# Patient Record
Sex: Female | Born: 1935 | Race: White | Hispanic: No | Smoking: Former smoker
Health system: Southern US, Community
[De-identification: ages and names within clinical notes are randomized; demographics above are authoritative.]

## PROBLEM LIST (undated history)

## (undated) DIAGNOSIS — E039 Hypothyroidism, unspecified: Secondary | ICD-10-CM

## (undated) DIAGNOSIS — M8580 Other specified disorders of bone density and structure, unspecified site: Secondary | ICD-10-CM

## (undated) DIAGNOSIS — I639 Cerebral infarction, unspecified: Secondary | ICD-10-CM

## (undated) DIAGNOSIS — G309 Alzheimer's disease, unspecified: Secondary | ICD-10-CM

## (undated) DIAGNOSIS — E119 Type 2 diabetes mellitus without complications: Secondary | ICD-10-CM

## (undated) DIAGNOSIS — M1990 Unspecified osteoarthritis, unspecified site: Secondary | ICD-10-CM

## (undated) DIAGNOSIS — F0280 Dementia in other diseases classified elsewhere without behavioral disturbance: Secondary | ICD-10-CM

## (undated) DIAGNOSIS — E785 Hyperlipidemia, unspecified: Secondary | ICD-10-CM

## (undated) DIAGNOSIS — M7070 Other bursitis of hip, unspecified hip: Secondary | ICD-10-CM

## (undated) DIAGNOSIS — I10 Essential (primary) hypertension: Secondary | ICD-10-CM

## (undated) DIAGNOSIS — K5900 Constipation, unspecified: Secondary | ICD-10-CM

## (undated) HISTORY — PX: OTHER SURGICAL HISTORY: SHX169

---

## 1998-02-20 ENCOUNTER — Ambulatory Visit (HOSPITAL_COMMUNITY): Admission: RE | Admit: 1998-02-20 | Discharge: 1998-02-20 | Payer: Self-pay | Admitting: Obstetrics & Gynecology

## 1999-11-04 ENCOUNTER — Other Ambulatory Visit: Admission: RE | Admit: 1999-11-04 | Discharge: 1999-11-04 | Payer: Self-pay | Admitting: Internal Medicine

## 2000-05-25 ENCOUNTER — Other Ambulatory Visit: Admission: RE | Admit: 2000-05-25 | Discharge: 2000-05-25 | Payer: Self-pay | Admitting: Obstetrics and Gynecology

## 2000-06-18 ENCOUNTER — Encounter (INDEPENDENT_AMBULATORY_CARE_PROVIDER_SITE_OTHER): Payer: Self-pay | Admitting: Specialist

## 2000-06-18 ENCOUNTER — Other Ambulatory Visit: Admission: RE | Admit: 2000-06-18 | Discharge: 2000-06-18 | Payer: Self-pay | Admitting: Obstetrics and Gynecology

## 2000-10-22 ENCOUNTER — Encounter: Admission: RE | Admit: 2000-10-22 | Discharge: 2000-10-22 | Payer: Self-pay | Admitting: Geriatric Medicine

## 2000-10-22 ENCOUNTER — Encounter: Payer: Self-pay | Admitting: Geriatric Medicine

## 2000-11-12 ENCOUNTER — Encounter: Payer: Self-pay | Admitting: Specialist

## 2000-11-19 ENCOUNTER — Inpatient Hospital Stay (HOSPITAL_COMMUNITY): Admission: RE | Admit: 2000-11-19 | Discharge: 2000-11-22 | Payer: Self-pay | Admitting: Specialist

## 2001-08-19 ENCOUNTER — Other Ambulatory Visit: Admission: RE | Admit: 2001-08-19 | Discharge: 2001-08-19 | Payer: Self-pay | Admitting: Obstetrics and Gynecology

## 2002-05-10 ENCOUNTER — Emergency Department (HOSPITAL_COMMUNITY): Admission: EM | Admit: 2002-05-10 | Discharge: 2002-05-10 | Payer: Self-pay | Admitting: *Deleted

## 2002-06-25 ENCOUNTER — Encounter: Payer: Self-pay | Admitting: Emergency Medicine

## 2002-06-25 ENCOUNTER — Emergency Department (HOSPITAL_COMMUNITY): Admission: EM | Admit: 2002-06-25 | Discharge: 2002-06-26 | Payer: Self-pay | Admitting: Emergency Medicine

## 2002-06-28 ENCOUNTER — Encounter: Admission: RE | Admit: 2002-06-28 | Discharge: 2002-06-28 | Payer: Self-pay | Admitting: Geriatric Medicine

## 2002-06-28 ENCOUNTER — Encounter: Payer: Self-pay | Admitting: Geriatric Medicine

## 2002-09-19 ENCOUNTER — Encounter: Payer: Self-pay | Admitting: Geriatric Medicine

## 2002-09-19 ENCOUNTER — Encounter: Admission: RE | Admit: 2002-09-19 | Discharge: 2002-09-19 | Payer: Self-pay | Admitting: Geriatric Medicine

## 2002-11-06 ENCOUNTER — Encounter: Payer: Self-pay | Admitting: Emergency Medicine

## 2002-11-06 ENCOUNTER — Inpatient Hospital Stay (HOSPITAL_COMMUNITY): Admission: EM | Admit: 2002-11-06 | Discharge: 2002-11-10 | Payer: Self-pay | Admitting: Emergency Medicine

## 2002-11-08 ENCOUNTER — Encounter: Payer: Self-pay | Admitting: Geriatric Medicine

## 2002-11-22 ENCOUNTER — Encounter: Admission: RE | Admit: 2002-11-22 | Discharge: 2003-02-20 | Payer: Self-pay | Admitting: Geriatric Medicine

## 2003-02-28 ENCOUNTER — Encounter: Admission: RE | Admit: 2003-02-28 | Discharge: 2003-05-29 | Payer: Self-pay | Admitting: Geriatric Medicine

## 2003-03-27 ENCOUNTER — Other Ambulatory Visit: Admission: RE | Admit: 2003-03-27 | Discharge: 2003-03-27 | Payer: Self-pay | Admitting: Obstetrics and Gynecology

## 2003-10-10 ENCOUNTER — Ambulatory Visit (HOSPITAL_COMMUNITY): Admission: RE | Admit: 2003-10-10 | Discharge: 2003-10-10 | Payer: Self-pay | Admitting: Gastroenterology

## 2003-11-27 ENCOUNTER — Ambulatory Visit (HOSPITAL_COMMUNITY): Admission: RE | Admit: 2003-11-27 | Discharge: 2003-11-27 | Payer: Self-pay | Admitting: Neurology

## 2004-04-01 ENCOUNTER — Emergency Department (HOSPITAL_COMMUNITY): Admission: EM | Admit: 2004-04-01 | Discharge: 2004-04-01 | Payer: Self-pay | Admitting: Emergency Medicine

## 2005-01-18 ENCOUNTER — Emergency Department (HOSPITAL_COMMUNITY): Admission: EM | Admit: 2005-01-18 | Discharge: 2005-01-18 | Payer: Self-pay | Admitting: Emergency Medicine

## 2005-02-05 ENCOUNTER — Encounter: Admission: RE | Admit: 2005-02-05 | Discharge: 2005-02-05 | Payer: Self-pay | Admitting: Geriatric Medicine

## 2005-03-29 ENCOUNTER — Emergency Department (HOSPITAL_COMMUNITY): Admission: EM | Admit: 2005-03-29 | Discharge: 2005-03-29 | Payer: Self-pay | Admitting: Emergency Medicine

## 2005-08-04 ENCOUNTER — Other Ambulatory Visit: Admission: RE | Admit: 2005-08-04 | Discharge: 2005-08-04 | Payer: Self-pay | Admitting: Obstetrics and Gynecology

## 2006-11-30 ENCOUNTER — Other Ambulatory Visit: Admission: RE | Admit: 2006-11-30 | Discharge: 2006-11-30 | Payer: Self-pay | Admitting: Obstetrics and Gynecology

## 2007-09-10 DIAGNOSIS — I639 Cerebral infarction, unspecified: Secondary | ICD-10-CM

## 2007-09-10 HISTORY — DX: Cerebral infarction, unspecified: I63.9

## 2007-09-29 ENCOUNTER — Inpatient Hospital Stay (HOSPITAL_COMMUNITY): Admission: EM | Admit: 2007-09-29 | Discharge: 2007-10-07 | Payer: Self-pay | Admitting: Emergency Medicine

## 2007-09-30 ENCOUNTER — Encounter (INDEPENDENT_AMBULATORY_CARE_PROVIDER_SITE_OTHER): Payer: Self-pay | Admitting: Internal Medicine

## 2007-09-30 ENCOUNTER — Ambulatory Visit: Payer: Self-pay | Admitting: Vascular Surgery

## 2008-03-28 ENCOUNTER — Other Ambulatory Visit: Admission: RE | Admit: 2008-03-28 | Discharge: 2008-03-28 | Payer: Self-pay | Admitting: Obstetrics and Gynecology

## 2009-05-29 ENCOUNTER — Encounter (INDEPENDENT_AMBULATORY_CARE_PROVIDER_SITE_OTHER): Payer: Self-pay | Admitting: Internal Medicine

## 2009-05-29 ENCOUNTER — Inpatient Hospital Stay (HOSPITAL_COMMUNITY): Admission: EM | Admit: 2009-05-29 | Discharge: 2009-05-31 | Payer: Self-pay | Admitting: Emergency Medicine

## 2010-06-19 ENCOUNTER — Emergency Department (HOSPITAL_COMMUNITY): Admission: EM | Admit: 2010-06-19 | Discharge: 2010-06-20 | Payer: Self-pay | Admitting: Emergency Medicine

## 2010-07-14 ENCOUNTER — Emergency Department (HOSPITAL_COMMUNITY): Admission: EM | Admit: 2010-07-14 | Discharge: 2010-07-14 | Payer: Self-pay | Admitting: Emergency Medicine

## 2010-09-02 ENCOUNTER — Encounter: Admission: RE | Admit: 2010-09-02 | Discharge: 2010-09-02 | Payer: Self-pay | Admitting: Geriatric Medicine

## 2011-01-22 LAB — DIFFERENTIAL
Basophils Absolute: 0 10*3/uL (ref 0.0–0.1)
Basophils Relative: 0 % (ref 0–1)
Eosinophils Relative: 11 % — ABNORMAL HIGH (ref 0–5)
Lymphocytes Relative: 14 % (ref 12–46)
Neutrophils Relative %: 62 % (ref 43–77)

## 2011-01-22 LAB — URINALYSIS, ROUTINE W REFLEX MICROSCOPIC
Bilirubin Urine: NEGATIVE
Glucose, UA: NEGATIVE mg/dL
Hgb urine dipstick: NEGATIVE
Ketones, ur: NEGATIVE mg/dL
Specific Gravity, Urine: 1.009 (ref 1.005–1.030)
Urobilinogen, UA: 0.2 mg/dL (ref 0.0–1.0)
pH: 6.5 (ref 5.0–8.0)

## 2011-01-22 LAB — GLUCOSE, CAPILLARY
Glucose-Capillary: 89 mg/dL (ref 70–99)
Glucose-Capillary: 94 mg/dL (ref 70–99)

## 2011-01-22 LAB — CBC
HCT: 35.2 % — ABNORMAL LOW (ref 36.0–46.0)
MCH: 31.1 pg (ref 26.0–34.0)
MCV: 91 fL (ref 78.0–100.0)
RBC: 3.86 MIL/uL — ABNORMAL LOW (ref 3.87–5.11)
WBC: 9.5 10*3/uL (ref 4.0–10.5)

## 2011-01-22 LAB — BASIC METABOLIC PANEL
Calcium: 8.9 mg/dL (ref 8.4–10.5)
Chloride: 95 mEq/L — ABNORMAL LOW (ref 96–112)
Creatinine, Ser: 0.74 mg/dL (ref 0.4–1.2)
GFR calc non Af Amer: >60 mL/min (ref >60)

## 2011-02-15 LAB — COMPREHENSIVE METABOLIC PANEL
ALT: 16 U/L (ref 0–35)
AST: 26 U/L (ref 0–37)
Albumin: 3.5 g/dL (ref 3.5–5.2)
Alkaline Phosphatase: 58 U/L (ref 39–117)
GFR calc Af Amer: >60 mL/min (ref >60)
Potassium: 4.6 mEq/L (ref 3.5–5.1)
Sodium: 131 mEq/L — ABNORMAL LOW (ref 135–145)
Total Protein: 6.3 g/dL (ref 6.0–8.3)

## 2011-02-15 LAB — CARDIAC PANEL(CRET KIN+CKTOT+MB+TROPI)
CK, MB: 3.3 ng/mL (ref 0.3–4.0)
CK, MB: 3.5 ng/mL (ref 0.3–4.0)
Relative Index: 3.1 — ABNORMAL HIGH (ref 0.0–2.5)
Relative Index: INVALID (ref 0.0–2.5)
Total CK: 94 U/L (ref 7–177)

## 2011-02-15 LAB — HEPATIC FUNCTION PANEL
AST: 30 U/L (ref 0–37)
Bilirubin, Direct: <0.1 mg/dL (ref 0.0–0.3)

## 2011-02-15 LAB — GLUCOSE, CAPILLARY
Glucose-Capillary: 388 mg/dL — ABNORMAL HIGH (ref 70–99)
Glucose-Capillary: 394 mg/dL — ABNORMAL HIGH (ref 70–99)
Glucose-Capillary: 415 mg/dL — ABNORMAL HIGH (ref 70–99)
Glucose-Capillary: 437 mg/dL — ABNORMAL HIGH (ref 70–99)

## 2011-02-15 LAB — LIPID PANEL
HDL: 73 mg/dL (ref >39)
LDL Cholesterol: 77 mg/dL (ref 0–99)
Total CHOL/HDL Ratio: 2.2 RATIO
Triglycerides: 50 mg/dL (ref <150)
VLDL: 10 mg/dL (ref 0–40)

## 2011-02-15 LAB — BASIC METABOLIC PANEL
CO2: 26 mEq/L (ref 19–32)
Calcium: 9.5 mg/dL (ref 8.4–10.5)
Creatinine, Ser: 0.6 mg/dL (ref 0.4–1.2)
GFR calc non Af Amer: >60 mL/min (ref >60)
Glucose, Bld: 84 mg/dL (ref 70–99)
Potassium: 3.9 mEq/L (ref 3.5–5.1)

## 2011-02-15 LAB — URINALYSIS, ROUTINE W REFLEX MICROSCOPIC
Bilirubin Urine: NEGATIVE
Glucose, UA: NEGATIVE mg/dL
Hgb urine dipstick: NEGATIVE
Ketones, ur: NEGATIVE mg/dL
Specific Gravity, Urine: 1.005 (ref 1.005–1.030)
pH: 7 (ref 5.0–8.0)

## 2011-02-15 LAB — URINE CULTURE: Colony Count: >=100000

## 2011-02-15 LAB — URINE MICROSCOPIC-ADD ON

## 2011-02-15 LAB — APTT: aPTT: 24 seconds (ref 24–37)

## 2011-02-15 LAB — DIFFERENTIAL
Basophils Absolute: 0 10*3/uL (ref 0.0–0.1)
Eosinophils Relative: 5 % (ref 0–5)
Lymphocytes Relative: 23 % (ref 12–46)
Lymphs Abs: 2 10*3/uL (ref 0.7–4.0)
Monocytes Absolute: 0.9 10*3/uL (ref 0.1–1.0)
Monocytes Relative: 11 % (ref 3–12)
Neutro Abs: 5.2 10*3/uL (ref 1.7–7.7)

## 2011-02-15 LAB — CBC
Hemoglobin: 12.6 g/dL (ref 12.0–15.0)
MCHC: 33.8 g/dL (ref 30.0–36.0)
Platelets: 389 10*3/uL (ref 150–400)
Platelets: 403 10*3/uL — ABNORMAL HIGH (ref 150–400)
RDW: 12 % (ref 11.5–15.5)
RDW: 12.3 % (ref 11.5–15.5)

## 2011-02-15 LAB — RAPID URINE DRUG SCREEN, HOSP PERFORMED
Cocaine: NOT DETECTED
Opiates: NOT DETECTED
Tetrahydrocannabinol: NOT DETECTED

## 2011-02-15 LAB — HEMOGLOBIN A1C: Mean Plasma Glucose: 146 mg/dL

## 2011-02-15 LAB — ETHANOL: Alcohol, Ethyl (B): <5 mg/dL (ref 0–10)

## 2011-02-15 LAB — CK TOTAL AND CKMB (NOT AT ARMC)
CK, MB: 3.7 ng/mL (ref 0.3–4.0)
Relative Index: 3.4 — ABNORMAL HIGH (ref 0.0–2.5)

## 2011-02-15 LAB — VITAMIN B12: Vitamin B-12: 1778 pg/mL — ABNORMAL HIGH (ref 211–911)

## 2011-02-15 LAB — HOMOCYSTEINE: Homocysteine: 9.3 umol/L (ref 4.0–15.4)

## 2011-03-24 NOTE — H&P (Signed)
NAMESALINDA, [name]               ACCOUNT NO.:  [id number]   MEDICAL RECORD NO.:  [id number]          PATIENT TYPE:  EMS   LOCATION:  MAJO                         FACILITY:  [facility]   PHYSICIAN:  [name], M.D.DATE OF BIRTH:  Jun 03, 1924   DATE OF ADMISSION:  09/29/2007  DATE OF DISCHARGE:                              HISTORY & PHYSICAL   PRIMARY CARE PHYSICIAN:  [name], M.D.   CONSULTATIONSJanalyn Shy P. Pearlean Jordan, M.D., neurology.   CHIEF COMPLAINT:  Stroke.   HISTORY OF PRESENT ILLNESS:  The patient is an 77 year old white female  with past medical history of hypertension, hypothyroidism, and poorly  controlled diabetes mellitus, who lives at home by herself.  She was  found on the kitchen floor by her son at approximately 5:30, last seen  alert and oriented x3 by her son the night before.  Now she appeared to  be confused somewhat.  She was brought in.  Her CBG was found to be 377.  The patient underwent a CT scan of her head and she was found to have a  large infarct at the right basal ganglia, right caudate head, and  anterior limb of the right internal capsule, acute to subacute in age,  with some slight mass effect upon the right lateral ventricle.  No  evidence of any intracranial hemorrhage.  Her blood pressure on  admission was noted to be 213/127, heart rate of 105.  The patient was  not given any intervention.  She had lab work and she was found to have  a white count of 14.6 with an 82% shift.  The rest of her labs were  notable for a glucose of 309.  The patient herself appears to be  somewhat confused.  She knows that she is in the hospital and knows who  she is, but otherwise cannot tell me anything beyond that.  She does not  know why she is here.  She complains of a mild headache which she says  has been going on for some time, but she is not coherent in terms of  time frame.  She denies any chest pain or palpitations, no shortness of  breath, no  wheezing, no coughing, and no abdominal pain.  No hematuria,  dysuria, constipation, or diarrhea.  No focal extremity numbness,  weakness, or pain.   REVIEW OF SYSTEMS:  Otherwise negative.   PAST MEDICAL HISTORY:  Hypertension.  History of hypothyroidism based on  old reports, and diabetes mellitus poorly controlled.  She is on Lantus  and NovoLog sliding scale and Trimipramine.  Unclear if she is on any  thyroid or hypertensive medications.   ALLERGIES:  No known drug allergies.   SOCIAL HISTORY:  No current tobacco, alcohol, or drug use.  She lives at  home with a son who lives nearby.   FAMILY HISTORY:  Noncontributory.   PHYSICAL EXAMINATION:  VITAL SIGNS:  Admission temperature 97.1, heart  rate 105, blood pressure 213/127, respirations 14, and O2 saturation  100% on 2 liters.  I have ordered 5 of IV Lopressor.  Since then  her  blood pressure has come down slightly to 198/100 with heart rate of 95.  GENERAL:  She is alert and oriented x2.  HEENT:  Normocephalic and atraumatic.  Her mucous membranes are slightly  dry.  She has some mild ptosis of her right eye lid, but otherwise  Cranial nerves II-XII appear to be intact.  HEART:  Irregular rate and rhythm, S1 and S2.  2/6 systolic ejection  murmur.  Mild tachycardia.  LUNGS:  Clear to auscultation bilaterally.  ABDOMEN:  Soft, nontender, and nondistended.  Positive bowel sounds.  EXTREMITIES:  No cyanosis, clubbing, or edema.  She appears to have some  signs of peripheral neuropathy and it is difficult to assess sensation  or acute sensory deficits in her lower extremities.  She appears to have  somewhat diminished grip but more symmetrical.  I am having a hard time  finding focal deficits musculoskeletal.  She may have some trace  weakness on the left, but it is difficult to assess, overall she has not  putting much effort.  She is, however, able to follow commands.   LABORATORY DATA:  CT scan of the head is as per HPI.   Chest x-ray is  unremarkable.  White count 14.6, H&H 13.3 and 39, MCV 95, platelet count  428, 82% shift.  Sodium 134, potassium 4, chloride 95, bicarb 28, BUN  20, creatinine 0.8, glucose 309.  LFT's unremarkable except for an AST  of 48.  UA notes moderate hemoglobin greater than 1000 glucose, 40  ketones, total protein 30.  CPK greater than 500, MB 19.9, troponin I  less than 0.05.   Her EKG shows a normal sinus rhythm.   ASSESSMENT:  1. Acute to subacute cerebrovascular accident involving significant      structure on the right.  Likely secondary to hypertensive disease.      We will plan to check an MRI, MRA, two-dimensional echocardiogram,      bilateral carotid Dopplers, as well as a fasting lipid profile and      homocysteine level.  Put her on IV Lopressor just to get her blood      pressure below 200.  We will also get PT/OT evaluation and      neurology consult.  The patient may end up needing a change of      setting post discharge.  2. Hypertensive heart disease.  We need to confirm her medications.      We may need to put her on an ACE as well as possible beta blocker.  3. Diabetes mellitus.  Confirm Lantus dose.  Check hemoglobin A1C,      sliding scale.  4. Rhabdomyolysis.  Gentle hydration secondary to a fall and unknown      length of time on the floor.      [name], M.D.  Electronically Signed     SKK/MEDQ  D:  09/29/2007  T:  09/30/2007  Job:  [id number]   cc:   [name], M.D.  [name], M.D.

## 2011-03-24 NOTE — Discharge Summary (Signed)
NAMEADRIAN, [name]               ACCOUNT NO.:  [id number]   MEDICAL RECORD NO.:  [id number]          PATIENT TYPE:  INP   LOCATION:  3004                         FACILITY:  [facility]   PHYSICIAN:  Kela Millin, M.D.DATE OF BIRTH:  04/12/1924   DATE OF ADMISSION:  09/29/2007  DATE OF DISCHARGE:  10/07/2007                               DISCHARGE SUMMARY   DISCHARGE DIAGNOSES:  1. Right basal ganglia acute infarct.  2. Malignant hypertension.  3. Uncontrolled diabetes.  4. Hypothyroidism.  5. Rhabdomyolysis, resolved.   PROCEDURE/STUDIES:  1. CT scan of the head:  Atrophy with a small vessel chronic ischemic      changes of deep cerebral white matter.  Large infarct of right      basal ganglia.  Right carotid head and anterior limb of the      internal capsule.  Acute to subacute in age with slight mass effect      upon the right lateral ventricle.  No intracranial hemorrhage.  2. CT scan of the spine:  No acute bony abnormalities segmentation      anomaly of C3-4.  Significant degenerative disk disease changes at      C4-5, C6-7, with severe encroachment upon the right cervical neural      foramen at C4-5 by uncovertebral formation.  3. MRI of the brain.  A 2.5 cm acute/subacute stroke effecting the      basal ganglia limb of the internal capulse and the anterior limb of      the internal capsule on the right.  Chronic atrophy and small      vessel ischemic changes of limb.  4. MRA of brain.  Extensive widespread medium vessel intracranial      atherosclerotic change, potential fluid-limiting stenosis as seen      in the left anterior and middle cerebral arteries, both posterior      cerebral  arteries and cerebellar branches.  5. Carotid Dopplers ultrasound.  Vertebral artery flow antegrade      bilaterally, left vertebral waveform, pulsatile.  No significant      right ICA stenosis.  Also no significant left ICA stenosis.  6. 2-D echocardiogram.  Overall left ventricular  systolic function      normal.  Ejection fraction 65-70%.  No left ventricular regional      wall motion abnormalities.  Doppler parameters consistent with      abnormal left ventricular relaxation.  This ventricular filling      pattern is also seen with aging with aging.   CONSULTATIONS:  Neurology, Dr. Orlin Hilding.   BRIEF HISTORY:  The patient is an 77 year old white female with the  above list of medical problems who was brought to the emergency room  after she was found on the floor by her son.  In the ER, she was noted  to be confused and a CT scan of the head was done which revealed a right  basal ganglion, right caudad head, and anterior limb of the right  internal capsule, resolved as already stated above.  The patient's blood  pressure on admission was noted  to be markedly elevated at 213/127 with  a heart rate of 105.  Her limbs revealed a blood glucose of 309 and she  was not able to give a history secondary to her confusion. She did admit  to mild headache which she reported  had been going on for some time.  She denied chest pain, palpitations, shortness of breath, coughing, and  no abdominal pain.  Also the patient denied any focal weakness,  numbness, and also no pain.  She was admitted to the hospital for  further evaluation and management.   Please see the full admission history and physical dictated on September 29, 2007 by Dr. Rito Ehrlich for the details of that admission as well as  exam and laboratory data.   HOSPITAL COURSE:  Problem #1.  Acute right basal ganglion infarct.  Upon  admission, the patient was started on aspirin and MRA/MRI, 2-D  echocardiogram and carotid Doppler ultrasound were ordered and the  results as stated above.  Neurology was consulted and Dr. Orlin Hilding saw  the patient and her impression was that this was likely secondary to  small vessel disease due to her hypotension and diabetes.  She noted  that it was remarkable that the patient has no  motor or sensory  deficits. She agreed with treating the patient with aspirin as the  patient had reported she had stopped taking her aspirin about a week  prior to presentation. Zocor was also recommended and this was done.  Her carotid Doppler ultrasound was negative for ICA stenosis.  PT/OT  were consulted and they followed the patient throughout her hospital  stay.  Initially, they recommended rehab/skilled nursing due to her  confusion and had decreased ability to perform her ADLs safely.  It was  also noted from other family and patient's neighbors that they were  concerned about the patient's son as he was an alcoholic and had been  unemployed for sometime and that he was of not much help in taking care  of the patient. Mrs. Jobst's mental status improved.  She has been  alert and has been answering questions appropriately. She also per  physical and occupational therapy has improved in her abilities to  perform her ADLs and at this time, the therapist has recommended an  assisted-living facility.  The patient's son has been involved in the  process and is in agreement with her going to the assisted-living  facility as well as the patient and social work have assisted with her  placement and noted today that she has a bed at [facility].   Problem #2.  Malignant hypertension.  The patient's blood pressure on  admission noted to be 213/127 and she had not previously been on any  blood pressure medications.  The patient was treated with IV Lopressor  and lisinopril and her blood pressures have improved.  She will be  discharged on lisinopril 20 mg p.o. b.i.d. and she is to follow up with  her primary care physician for further adjustment of her blood pressure  medication for optimal blood pressure control as appropriate.  She had a  fasting lipid profile done and it revealed a total cholesterol of 193  with ACL of 39, LDL 94.  As noted above already, neurology recommended   initiating Zocor and the patient will continue Zocor upon discharge.   Problem #3.  Uncontrolled diabetes mellitus.  The patient's blood sugars  were elevated upon admission and her hemoglobin A1C was done and it was  also elevated at 10.2.  Her Accu-Cheks were monitored and her Lantus  adjusted as appropriate for better blood glucose control, and also she  was covered with sliding scale insulin.  She will be discharged on  Lantus 20 units at bedtime.  Follow up with primary care physician for  further monitoring and management of her blood sugars.   Problem #4.  Rhabdomyolysis.  The patient's total CK on admission was  noted to be elevated at 709 with troponin-I within normal limits.  It  was noted that the patient had been found on the floor and was,  therefore, of unknown duration.  The impression was rhabdomyolysis and  the patient was hydrated and her CPK had improved to 253 upon recheck.   Problem #5. Hypothyroidism.  The patient was maintained on levothyroxine  during her hospital stay.   DISCHARGE MEDICATIONS:  1. Aspirin 325 mg p.o. daily.  2. Prilosec 20 mg p.o. daily.  3. Lantus 20 units at bedtime.  4. Sliding scale NovoLog.  5. Levothyroxine 117 mcg daily.  6. Lisinopril 20 mg p.o. b.i.d.  7. Zocor 20 mg p.o. at bedtime.  8. Os-Cal 1000 mg daily.  9. Boniva p.o. once a month.  10.Glucosamine one capsule daily.  11.Ambien 5 mg p.o. at bedtime p.r.n.  12.Tylenol 650 mg p.o. q.6h. p.r.n.   SPECIAL INSTRUCTIONS:  Accu-Cheks q.a.c. and at bedtime with sliding  scale coverage as above.   DISCHARGE DIET:  Two-gram sodium diabetic diet.   CONDITION ON DISCHARGE:  Improved/stable.   FOLLOW UP:  Nursing home physician in one to two days.      Kela Millin, M.D.  Electronically Signed     ACV/MEDQ  D:  10/07/2007  T:  10/07/2007  Job:  [id number]   cc:   Hal T. Stoneking, M.D.

## 2011-03-24 NOTE — H&P (Signed)
[name], KOT NO.:  [id number]   MEDICAL RECORD NO.:  [id number]          PATIENT TYPE:  INP   LOCATION:  1824                         FACILITY:  [facility]   PHYSICIAN:  Ramiro Harvest, MD    DATE OF BIRTH:  December 20, 1923   DATE OF ADMISSION:  05/29/2009  DATE OF DISCHARGE:                              HISTORY & PHYSICAL   ADDENDUM:   ASSESSMENT AND PLAN:  Probable urinary tract infection.  Will check a  urine culture.  Will place on IV Rocephin for now and follow.   It was a pleasure taking care of Ms. [name].      Ramiro Harvest, MD  Electronically Signed     DT/MEDQ  D:  05/29/2009  T:  05/29/2009  Job:  [id number]   cc:   Hal T. Stoneking, M.D.

## 2011-03-24 NOTE — H&P (Signed)
NAMEZEIDY, Jordan NO.:  [id number]   MEDICAL RECORD NO.:  [id number]          PATIENT TYPE:  EMS   LOCATION:  MAJO                         FACILITY:  [facility]   PHYSICIAN:  Ramiro Harvest, MD    DATE OF BIRTH:  03/16/1924   DATE OF ADMISSION:  05/29/2009  DATE OF DISCHARGE:                              HISTORY & PHYSICAL   PRIMARY CARE PHYSICIAN:  Dr. Merlene Laughter of [facility].   HISTORY OF PRESENT ILLNESS:  [name] is an 76 year old white  female history with type 1 diabetes, hypertension, hypothyroidism, prior  tobacco history, history of right basal ganglia infarct November 2008,  who presented to the ED with dizziness on awakening around midnight,  right facial droop and slurred speech per son.  Son states that the  patient got up to go to the bathroom and was unable to get up, so he had  to help walk her to the bathroom.  He then noticed she had a right  facial droop and some slurred speech.  He proceeded to check her blood  glucose level which was 51.  EMS was called.  On arrival with EMS, the  patient's symptoms had started to improve.  The patient's son denied any  chest pain.  No shortness of breath, no nausea, no vomiting, no fever,  no chills, no diarrhea, no constipation, no dysuria, no visual changes,  no weakness or numbness, no abdominal pain, no other associated  symptoms.  The patient then presented to the ED.  Head CT obtained was  negative for any acute infarct or hemorrhage.  CBC was within normal  limits.  BMET with a sodium of 132, otherwise was within normal limits.  Urinalysis was nitrite negative, had large leukocytes, WBCs of 11-20.  We were called to admit the patient.  At the time of the interview, the  patient's symptoms had resolved and the patient was essentially back to  baseline with expressive aphasia which per son was chronic in nature  since her last CVA.   ALLERGIES:  NO KNOWN DRUG ALLERGIES.   PAST MEDICAL  HISTORY:  1. Right basal ganglia infarct, September 29, 2007.  2. Hypertension.  3. Type 1 diabetes.  4. Hypothyroidism.  5. DJD.  6. Questionable gastroesophageal reflux disease.  7. History of cervical polyps.  8. Status post right rotator cuff reconstruction.  9. Left hip bursitis.  10.Osteopenia.   HOME MEDICATIONS:  1. Levothyroxine 125 mcg p.o. daily  2. NovoLog sliding scale 9 units with breakfast, 7 units with lunch      and 8 units with supper.  3. Lantus 13 units subcutaneously nightly.  4. Lisinopril 20 mg p.o. b.i.d.  5. Melatonin 3 grams p.o. nightly.  6. Simvastatin 20 mg p.o. daily.  7. Calcium Plus D 600/200 p.o. b.i.d.  8. Aspirin 325 mg p.o. daily.  9. Glucosamine 500 mg p.o. daily.  10.Vitamin D 50,000 units p.o. q. weekly.  11.Multivitamin daily.  12.Boniva 150 mg p.o. daily   SOCIAL HISTORY:  The patient lives with her son, is widowed.  No tobacco  use.  No current alcohol use, quit about 1-1/2 years ago and no IV drug  use.   FAMILY HISTORY:  Noncontributory.   REVIEW OF SYSTEMS:  As per HPI, otherwise negative.   PHYSICAL EXAMINATION:  VITAL SIGNS:  Temperature 97.4, blood pressure  161/76, pulse of 74, respirations 17.  Saturating 98% on room air.  GENERAL:  Patient laying on gurney in no apparent distress.  HEENT:  Normocephalic, atraumatic.  Pupils equal, round and reactive to  light and accommodation.  Extraocular movements intact.  Oropharynx is  clear.  No lesions, no exudates.  NECK:  Supple.  No lymphadenopathy.  RESPIRATORY:  Lungs are clear to auscultation bilaterally.  CARDIOVASCULAR:  Regular rate and rhythm.  No murmurs, rubs or gallops.  ABDOMEN:  Soft, nontender, nondistended.  Positive bowel sounds.  EXTREMITIES:  No clubbing, cyanosis or edema.  NEUROLOGICAL:  The patient is alert and oriented x3.  Cranial nerves II-  XII are grossly intact.  She has 5/5 bilateral upper extremity strength  and 5/5 bilateral lower extremity  strength.  Visual fields are intact.  Sensation is intact.  Negative Babinski.  Unable to elicit reflexes  symmetrically and diffusely.  Finger-to-nose with slight dysmetria on  the left.  Gait not tested secondary to safety.   ADMISSION LABORATORY DATA:  Sodium of 132, potassium of 3.9, chloride of  98, bicarb 26, BUN 17, creatinine 0.6, glucose of 84, calcium of 9.5.  CBC with a white count of 8.5, hemoglobin 12.0, platelets of 403,  hematocrit 35.5, AST of 5.2.  Urinalysis done was yellow, cloudy,  specific gravity 1.005, pH of 7, glucose negative, bilirubin negative,  ketones negative, blood negative, protein negative, urobilinogen 0.2,  nitrite negative, leukocytes large.  Urine microscopy, WBCs 11-20, RBCs  0-2, bacteria was few.  Chest x-ray which was done showed cardiomegaly  without congestive heart failure.  Head CT without contrast done showed  remote infarct within the right sided basal ganglia, including the  caudate head.  New increased density in this area is most likely related  to calcifications/laminar necrosis, small amount of hemorrhage cannot be  excluded.  Short-term follow-up CT or further evaluation with MRI should  be considered.  Small vessel ischemic change without evidence of acute  superimposed infarct.  EKG with a normal sinus rhythm.   ASSESSMENT AND PLAN:  [name] is an 77 year old female with  history of a prior cerebrovascular accident, type 1 diabetes,  hypertension and hyperlipidemia presenting to the emergency department  with some dizziness, right facial droop and slurred speech.  We were  called to admit the patient for concerns of a probable transient  ischemic attack.   1. Facial droop/slurred speech, likely secondary to a transient      ischemic attack versus hypoglycemic spell with a CBG of 55.      Patient with a prior stroke, hypertension, diabetes and history of      tobacco use.  We will admit the patient to telemetry.  Check  an      MRI/MRA of the head.  Check carotid Dopplers.  Check a 2-D echo.      Cycle cardiac enzymes q.8 hours x3.  Hepatic panel, hemoglobin A1c      for further evaluation and recommendations.  May need to get a      neurology consult.  PT/OT, aspirin.  If MRI is positive, may      consider changing the patient to Aggrenox.  She was on aspirin  prior to onset of her symptoms.  2. Type 1 diabetes.  Check a hemoglobin A1c.  Hold Lantus sliding      scale insulin.  3. Hypertension.  Hold blood pressure medications.  4. Hypothyroidism.  Check a TSH.  Continue home dose Synthroid.  5. Hyperlipidemia.  Check a fasting lipid panel.  Simvastatin.  6. Osteopenia.  7. Gastroesophageal reflux disease/Pepcid.  8. History of right basal ganglia cerebrovascular accident.  Aspirin.  9. Prophylaxis.  Pepcid for gastrointestinal prophylaxis, Lovenox for      deep venous thrombosis prophylaxis.   It has been a pleasure taking care of Ms. Marjie Skiff.      Ramiro Harvest, MD  Electronically Signed     DT/MEDQ  D:  05/29/2009  T:  05/29/2009  Job:  [id number]   cc:   Hal T. Stoneking, M.D.

## 2011-03-24 NOTE — Consult Note (Signed)
NAMECELENE, PIPPINS               ACCOUNT NO.:  [id number]   MEDICAL RECORD NO.:  [id number]          PATIENT TYPE:  INP   LOCATION:  3004                         FACILITY:  [facility]   PHYSICIAN:  Gustavus Messing. Orlin Hilding, M.D.DATE OF BIRTH:  01-24-1924   DATE OF CONSULTATION:  DATE OF DISCHARGE:                                 CONSULTATION   CHIEF COMPLAINT:  Acute right brain stroke.  The patient is actually  unable to articulate a chief complaint.   HISTORY OF PRESENT ILLNESS:  Tamila Gaulin is an 77 year old right-  handed white woman who was admitted yesterday, after being found on the  floor by her son.  Level of consciousness at the time unknown.  It is  unclear how long she was down.  She can not recall the incident at all.  She was last seen normal the night before.  Confusion is the main  finding.  Blood pressure was elevated significantly to 213/127, heart  rate 105 when she presented.  She had no complaints of weakness,  numbness or any specific symptoms.   REVIEW OF SYSTEMS:  A 13-system review is negative for any complaints at  all at this time.   PAST MEDICAL HISTORY:  Significant for diabetes, presumably type 2 and  hypertension, history of hypothyroidism.  Uncertain what medications she  takes normally, unclear whether she takes aspirin.   ALLERGIES:  No known drug allergies, as far as we can tell from the  chart.   SOCIAL HISTORY:  She lives alone at home, a son lives nearby.  She does  not use tobacco, alcohol or illicit drugs.  She is widowed.   FAMILY HISTORY:  Is noncontributory.   OBJECTIVE:  On exam:  VITAL SIGNS:  Temperature is 98.1, pulse 96, respirations 20, BP is now  109/55, down from 213/127, 97% sats.  CBG is 730.  HEENT:  Head is normocephalic, atraumatic.  NECK:  Supple without bruits.  HEART:  Regular rate and rhythm.  LUNGS:  Clear to auscultation.  NEUROLOGIC:  She is alert.  She is oriented to age and month.  She  follows 2/2 commands.  She  has full visual fields, full gaze.  No facial  sensory loss.  No facial droop.  No dysarthria.  Palate is symmetric and  tongue is midline.  She has no drift in either upper or lower  extremities and moves them all spontaneously and normally.  No ataxia on  finger-to-nose or heel-to-shin.  She has normal language skills without  aphasia and no dysarthria.  No extinction or neglect.  No clear sensory  loss.  NIH score of zero.  She clearly is somewhat confused, however.  MRI shows an acute infarct in the caudate head, the anterior limb of the  internal capsule and anterior putamen all on the right.  There is also  some mild small vessel disease and atrophy.  MRA shows diffuse  intracranial atherosclerotic changes, more pronounced on the left than  the right.  Homocystine level is normal 9.8, hemoglobin A1c is quite  elevated at 10.2.  Cholesterol is 193 with an  LDL of 94.   IMPRESSION:  Right basal ganglia stroke with confusion but remarkably no  motor or sensory deficits.  It is highly likely this is a small vessel  stroke secondary to hypertension and diabetes.   RECOMMENDATIONS:  Agree with treating with aspirin, if she has never  taken it in the past.  Otherwise,  start on Aggrenox,  unless the 2D  echo  unexpectedly shows some significant source of embolus.  I would  recommend starting Zocor 20 mg daily because of the cholesterol.  PT, OT  and ST are evaluating the patient.  The main concern is whether or not  her cognitive status will allow her to go home.  She did have carotid  Dopplers that showed no ICA stenosis.  Echo is pending at this time.  Really, nothing else to add from a neurologic perspective.      Catherine A. Orlin Hilding, M.D.  Electronically Signed     CAW/MEDQ  D:  09/30/2007  T:  10/01/2007  Job:  [id number]   cc:   Hal T. Stoneking, M.D.

## 2011-03-27 NOTE — H&P (Signed)
[name], SWILLING                       ACCOUNT NO.:  [id number]   MEDICAL RECORD NO.:  [id number]                   PATIENT TYPE:  INP   LOCATION:  0110                                 FACILITY:  [facility]   PHYSICIAN:  Marcene Duos, M.D.         DATE OF BIRTH:  June 03, 1924   DATE OF ADMISSION:  11/06/2002  DATE OF DISCHARGE:                                HISTORY & PHYSICAL   CHIEF COMPLAINT:  Multiple complaints including throat tightness, general  malaise, and polydipsia.   HISTORY OF PRESENT ILLNESS:  This is a 77 year old female patient of Hal T.  Stoneking, M.D. with a somewhat difficult history to follow but it sounds as  if she has developed a general malaise over the last couple of weeks with  polydipsia, fatigue, and definite worsening over the last two days.  The  patient states that she finally was asked to be brought to the emergency  room today after a choking episode occurring this afternoon.  The patient  describes a numbness or tightness of her throat with her tongue feeling like  it was swelling which ended up with her spitting up some clear fluid and  some nausea.  No overt vomiting, however.  She states that her sternal area  does not feel happy but she is unable to elaborate with more specific  details.  The patient denies any flank tenderness, polyuria, dysuria, or  definite urinary frequency, though she states she has not been able to  completely empty her bladder for some time.  No cough or definite shortness  of breath, though she mentioned shortness of breath in the emergency room.  She has had some nausea also in the last two days, not just today.  No fever  or chills.  The patient denies any history of diabetes mellitus.  She did  receive an epidural injection one and a half weeks ago with Gerlene Burdock D.  Ramos, M.D. for degenerative disk disease and radiculopathy and has been  treated since it sounds like August.  In the emergency room patient  was  found to have urinalysis with a specific gravity of greater than 1.040,  white blood cells from 11-20, many bacteria, and positive nitrate.  Peripheral white count was 37,000 with a hemoglobin of 15.1, platelets  344,000, 93% neutrophils.  Sodium mildly low at 129, bicarbonate 24, glucose  525, BUN 28, creatinine 1.1, alkaline phosphatase mildly elevated at 125.  Chest x-ray and the blood cultures currently pending.   PAST MEDICAL HISTORY:  1. Hypothyroidism.  2. Degenerative disk disease.  3. GERD which the patient questions.  4. Question of recent cervical polyp.  Apparently, she has had some vaginal     bleeding.   PAST SURGICAL HISTORY:  Right rotator cuff reconstruction.   MEDICATIONS:  1. Vicodin p.r.n.  2. Calcium.  3. Synthroid 88 mcg daily.  4. Glucosamine and chondroitin sulfate.   ALLERGIES:  BIAXIN which ruined her  system, questionably also SULFA.   SOCIAL HISTORY:  Tobacco:  Unknown use.  Alcohol:  One to two glasses of  wine q.h.s.  Caffeine drinks:  Tea daily, coffee on Saturdays only.   FAMILY HISTORY:  Father died age 53 of an MI.  Mother died age 61 pancreatic  cancer.  Brother died age 23 of an MI.  Brother who is six years younger has  developed bladder cancer and metastatic lung cancer.  Sister died age 24 of  lung cancer.  Son died at age 5 of alcoholism.  She has a son who is 37 who  is relatively healthy, but is a smoker.   SOCIAL HISTORY:  She is widowed.  Her husband died in December 05, 1999.  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   PHYSICAL EXAMINATION:  VITAL SIGNS:  Temperature 97.6, blood pressure  145/92, heart rate 110, respirations 24, 95% on room air.  GENERAL:  The patient appears alert, in no acute distress with good color.  HEENT:  Pupils are equal, round, and reactive to light.  Tympanic membranes  are clear bilaterally.  She does have bilateral hearing aids that were  removed for that examination.  Throat without  injection.  Mouth appears  somewhat dry.  NECK:  Supple without adenopathy.  CHEST:  Clear.  CARDIOVASCULAR:  Regular rate and rhythm with normal S1 and S2.  No S3, S4,  or murmur appreciated.  She has occasional extra systole.  No carotid  bruits.  Carotids, radial, DP, PT pulses normodynamic, equal.  ABDOMEN:  Soft, nontender.  No organomegaly or masses appreciated.  No flank  tenderness.  NEUROLOGIC:  Alert and oriented x3.  Motor is grossly within normal limits.   ASSESSMENT/PLAN:  1. Urinary tract infection, suspect upper tract.  She does not appear septic     or toxic despite her laboratory data.  Intravenous fluids, Cipro     intravenously, and send urine for culture.  Will follow her CBC with a     check in the morning and BMET as well.  2. Hyperglycemia, question as to whether this is new onset diabetes versus     combination of stress of illness and whether she received corticosteroids     in her epidural injection recently.  Will begin insulin sliding scale and     intravenous fluids to rehydrate.  It sounds as if this may have been     going on for some time which would suggest new onset diabetes.  3. Hyponatremia secondary to hyperglycemia, dehydration.  4. Dehydration.  Intravenous fluids.  5. Atypical chest discomfort.  Will check her chest x-ray, EKG, and one set     of enzymes but I do not believe at this point that this is cardiac     related.  It sounds as more so that she was developing nausea and a bit     of vomiting.                                               Marcene Duos, M.D.    EMM/MEDQ  D:  11/06/2002  T:  11/06/2002  Job:  [id number]

## 2011-03-27 NOTE — Discharge Summary (Signed)
[name], [name]                       ACCOUNT NO.:  [id number]   MEDICAL RECORD NO.:  [id number]                   PATIENT TYPE:  INP   LOCATION:  0347                                 FACILITY:  [facility]   PHYSICIAN:  Hal T. Stoneking, M.D.              DATE OF BIRTH:  June 22, 1924   DATE OF ADMISSION:  11/06/2002  DATE OF DISCHARGE:  11/10/2002                                 DISCHARGE SUMMARY   ADMISSION DIAGNOSIS:  Urinary tract infection.   DISCHARGE DIAGNOSES:  1. Urinary tract infection.  Of note, organism coagulase-negative     Staphylococcus which responded clinically to Cipro.  2. Possible transient ischemic attack.  3. Hypothyroidism.  4. Neck discomfort, question coronary ischemia.  Workup in progress.  5. Degenerative joint disease.  6. Gastroesophageal reflux disease.  7. Newly-diagnosed diabetes.   HISTORY OF PRESENT ILLNESS:  The patient presented to the emergency room  with a couple of weeks of polydipsia, fatigue, which worsened over the last  two days prior to admission.  She had had a choking episode where she felt  tightness around her neck and up into her jaws on the day of admission.  She  came into the emergency room for evaluation.  She denied any fevers, chills,  but stated that she had been nauseated off and on.   PHYSICAL EXAMINATION:  VITAL SIGNS:  Temperature 97.6, blood pressure  145/92, heart rate 110, respiratory rate 24.  O2 saturation 95% on room air.  HEENT:  Unremarkable.  LUNGS:  Clear.  HEART:  Regular rate and rhythm without murmur.  ABDOMEN:  Soft, nontender, no organomegaly noted.  NEUROLOGICAL:  Alert and oriented x3.   LABORATORY DATA ON ADMISSION:  CBC revealed a white count elevated to  37,000, hemoglobin 15.1, 93% neutrophils, platelet count 344,000.  Sodium  129, potassium 3.8, chloride 97, bicarb 24, BUN 28, creatinine 1.1, glucose  525, AST 23, ALT 20, alk phos 125, total bili 0.9.  Troponin 0.04 slightly  elevated, CK  41 with an MB of 2.3.  Urinalysis revealed 11-20 wbc's.   HOSPITAL COURSE:  The patient was admitted to a regular hospital bed, was  placed on IV Cipro.  She remained afebrile.  Her white count gradually came  down to 12.9.  It was obvious that she did have diabetes.  She was started  on Actos and Amaryl and her blood sugar the day prior to discharge was 154.  Her sodium also corrected.  During her hospitalization, her blood pressure  was elevated.  She was started on Altace 2.5 mg a day.  On the third day of  hospitalization, she seemed to be having some problems with expressing  herself, trouble finding words.  On November 08, 2002 she had an MRI/MRA  which revealed no acute findings.  She did have a slight narrowing in one of  the blood vessels but the large blood vessels were open.  She was placed on  an Ecotrin 325 mg a day.  She believes her speech to be getting better at  the time of discharge.   Newly-diagnosed diabetes, she was also seen by the nurse educators and was  taught how to use an Accu-Chek, also given dietary advice and will be  [facility]  [facility].   Neck pain.  She did rule out for an MI although her troponin was borderline  elevated.  EKG revealed nonspecific changes.  She was seen in consultation  by Armanda Magic, M.D. who has set her up for an adenosine Cardiolite on  November 13, 2002.   DISPOSITION:  The patient is discharged home in an improved condition.   MEDICATIONS:  1. Synthroid 0.088 mg a day.  2. Glucosamine.  3. Chondroitin.  4. Cipro 250 mg twice a day for an additional five days.  5. Actos 15 mg a day.  6. Amaryl 2 mg every morning.  7. Altace 2.5 mg a day.  8. Ecotrin 325 mg a day.   FOLLOW UP:  She will be seen back in the office in approximately 3-4 weeks.  She will have her adenosine Cardiolite done on November 13, 2002 at 1 p.m.                                               Hal T.  Pete Glatter, M.D.    HTS/MEDQ  D:  11/10/2002  T:  11/11/2002  Job:  [id number]   cc:   Armanda Magic, M.D.  [address]  [place], [place] [address]  Fax: [phone]

## 2011-03-27 NOTE — Consult Note (Signed)
[name], [name]                       ACCOUNT NO.:  [id number]   MEDICAL RECORD NO.:  [id number]                   PATIENT TYPE:  INP   LOCATION:  0347                                 FACILITY:  [facility]   PHYSICIAN:  Rhonda T. Stoneking, M.D.              DATE OF BIRTH:  [birth year]   DATE OF CONSULTATION:  11/07/2002  DATE OF DISCHARGE:                                   CONSULTATION   CHIEF COMPLAINT:  Chest pain.   HISTORY OF PRESENT ILLNESS:  This is a 77 year old female admitted with  general malaise, fatigue, dyspnea, nausea.  She was diagnosed with a urinary  tract infection.  In the hospital she complained of atypical chest pain, in  which she had sudden onset of throat pain which she described as a choking  pain radiating into her chest and down to her stomach.  She says she has had  this throat pain in the past.  Of note this is extremely difficult to get a  history from her.  She almost acted like she had an expressive aphasia.  In  the hospital her CPKs were negative x2.  Her troponin was 0.04.  She denies  any shortness of breath.   PAST MEDICAL HISTORY:  1. Significant for hypothyroidism.  2. Degenerative disk disease.  3. Gastroesophageal reflux disease.  4. Cervical polyps.   PAST SURGICAL HISTORY:  Status post right rotator cuff reconstruction.   SOCIAL HISTORY:  Denies tobacco use.  She drinks 1-2 glasses of alcohol.  Occasional tea and coffee.   MEDICATIONS:  1. Vicodin p.r.n.  2. Calcium.  3. Synthroid 88 mcg a day.  4. Glucosamine chondroitin.   ALLERGIES:  1. Question to [facility].  2. SULFA.   PHYSICAL EXAMINATION:  GENERAL:  The patient appears confused, difficulty  getting her words out.  Her son is with her today and says this is new for  her.  HEENT:  Benign.  NECK:  Supple without lymphadenopathy.  No bruits.  LUNGS:  Clear to auscultation throughout.  HEART:  Regular rate and rhythm.  No murmurs, rubs, or gallops.  Normal S1,  S2.  ABDOMEN:  Soft, nontender, nondistended, no active bowel sounds.  No  hepatosplenomegaly.  EXTREMITIES:  No cyanosis, clubbing, or edema.   REVIEW OF SYSTEMS:  Other than what is stated in the HPI, she does appear to  complain of having problems getting her words out today and some problems  with arthritis, otherwise negative.   LABORATORY DATA:  Sodium 131, potassium 4.8, chloride 100, bicarb 28, BUN  21, creatinine 0.9, glucose 277, CPK 41 and 31, MB 2.3 and 1.6, troponin  0.04.  White cell count 31.3, hemoglobin 12.7, hematocrit 36.8, platelet  count 312.   Chest x-ray shows no active disease.  EKG shows normal sinus rhythm and no  ST changes.   ASSESSMENT:  1. Atypical chest pain with negative cardiac enzymes x2.  No ischemia on     EKG.  Cardiac risk factors include her age.  2. Urinary tract infection.  3. Questionable expressive aphasia.   PLAN:  Will proceed with adenosine Cardiolite study to rule out  _______________.  Once her urinary tract infection and white blood cell  count have decreased, I will speak to Dr. Pete Jordan concerning expressive  aphasia.     [name], M.D.                        [name], M.D.    TT/MEDQ  D:  11/07/2002  T:  11/07/2002  Job:  [id number]

## 2011-03-27 NOTE — Op Note (Signed)
[name], [name]                       ACCOUNT NO.:  [id number]   MEDICAL RECORD NO.:  [id number]                   PATIENT TYPE:  AMB   LOCATION:  ENDO                                 FACILITY:  [facility]   PHYSICIAN:  Danise Edge, M.D.                DATE OF BIRTH:  [birth year]   DATE OF PROCEDURE:  10/10/2003  DATE OF DISCHARGE:                                 OPERATIVE REPORT   PROCEDURE:  Incomplete colonoscopy.   PROCEDURE INDICATION:  Ms. [name] is a 76 year old female born  1924/01/22.  Ms. [name] has chronic constipation unassociated with  gastrointestinal bleeding.  She is scheduled to undergo a screening  colonoscopy with polypectomy to prevent colon cancer.   ENDOSCOPIST:  Danise Edge, M.D.   PREMEDICATION:  Versed 5 mg, Demerol 50 mg.   DESCRIPTION OF PROCEDURE:  After obtaining informed consent, [name]  was placed in the left lateral decubitus position.  I administered  intravenous Demerol and intravenous Versed to achieve conscious sedation for  the procedure.  The patient' blood pressure, oxygen saturation, and cardiac  rhythm were monitored throughout the procedure and documented in the medical  record.   Anal inspection was normal.  Digital rectal exam was normal.  The Olympus  adjustable pediatric colonoscope was introduced into the rectum and advanced  to 100 cm from the anal verge.  Due to the presence of a pelvic colonic loop  which extended over to the right side of the pelvis, a complete colonoscopy  was not performed and I did not visualize the cecum.  I am unsure as to  exactly where in the proximal colon the endoscope was advanced.  Colonic  preparation for the exam today was excellent.   Endoscopic appearance of the rectum and colon with the Olympus adjustable  pediatric colonoscope advanced to approximately 100 cm from the anal verge  was completely normal.  There was no endoscopic evidence for the presence of  colorectal neoplasia.   ASSESSMENT:  Normal proctocolonoscopy with the endoscope advanced to 100 cm  from the anal verge.  A complete colonoscopy was not performed due to pelvic  colonic loop formation which could not be controlled by patient  repositioning and external abdominal pressure.  The cecum and ileocecal  valve were not visualized.   PLAN:  Ms. [name] will undergo an air contrast barium enema at [facility] radiology suite later this morning.                                               Danise Edge, M.D.    MJ/MEDQ  D:  10/10/2003  T:  10/10/2003  Job:  [id number]   cc:   Hal T. Stoneking, M.D.  [address]  [place], [place] [address]  Fax: [phone]

## 2011-08-18 LAB — CBC
HCT: 39.4
Hemoglobin: 12.3
Hemoglobin: 13.3
MCHC: 33.8
MCHC: 34.4
MCV: 94.9
RBC: 3.76 — ABNORMAL LOW
RBC: 4.15
RDW: 11.9
RDW: 12.1

## 2011-08-18 LAB — URINALYSIS, ROUTINE W REFLEX MICROSCOPIC
Bilirubin Urine: NEGATIVE
Ketones, ur: 40 — AB
Leukocytes, UA: NEGATIVE
Nitrite: NEGATIVE
Urobilinogen, UA: 0.2
pH: 6

## 2011-08-18 LAB — BASIC METABOLIC PANEL
CO2: 24
CO2: 29
Calcium: 8.7
Creatinine, Ser: 1.25 — ABNORMAL HIGH
GFR calc Af Amer: 50 — ABNORMAL LOW
GFR calc non Af Amer: 41 — ABNORMAL LOW
GFR calc non Af Amer: >60
Glucose, Bld: 116 — ABNORMAL HIGH
Potassium: 4.4
Sodium: 136
Sodium: 138

## 2011-08-18 LAB — COMPREHENSIVE METABOLIC PANEL
ALT: 25
BUN: 20
CO2: 28
Calcium: 9.2
Creatinine, Ser: 0.91
GFR calc non Af Amer: 59 — ABNORMAL LOW
Glucose, Bld: 309 — ABNORMAL HIGH
Sodium: 134 — ABNORMAL LOW
Total Protein: 6.9

## 2011-08-18 LAB — LIPID PANEL
LDL Cholesterol: 94
Total CHOL/HDL Ratio: 2.4
Triglycerides: 102
VLDL: 20

## 2011-08-18 LAB — POCT CARDIAC MARKERS
CKMB, poc: 19.9
Myoglobin, poc: >500
Operator id: 196461

## 2011-08-18 LAB — DIFFERENTIAL
Eosinophils Absolute: 0 — ABNORMAL LOW
Lymphocytes Relative: 8 — ABNORMAL LOW
Lymphs Abs: 1.1
Monocytes Relative: 9
Neutro Abs: 12 — ABNORMAL HIGH
Neutrophils Relative %: 82 — ABNORMAL HIGH

## 2011-08-18 LAB — CK TOTAL AND CKMB (NOT AT ARMC)
CK, MB: 11.5 — ABNORMAL HIGH
Relative Index: 1.6
Total CK: 709 — ABNORMAL HIGH

## 2011-08-18 LAB — GLUCOSE, RANDOM: Glucose, Bld: 422 — ABNORMAL HIGH

## 2011-08-18 LAB — URINE MICROSCOPIC-ADD ON

## 2011-08-18 LAB — HEMOGLOBIN A1C: Mean Plasma Glucose: 286

## 2013-02-06 ENCOUNTER — Encounter (HOSPITAL_COMMUNITY): Payer: Self-pay | Admitting: *Deleted

## 2013-02-06 ENCOUNTER — Emergency Department (HOSPITAL_COMMUNITY): Payer: Medicare Other

## 2013-02-06 ENCOUNTER — Inpatient Hospital Stay (HOSPITAL_COMMUNITY)
Admission: EM | Admit: 2013-02-06 | Discharge: 2013-03-09 | DRG: 393 | Disposition: E | Payer: Medicare Other | Attending: Family Medicine | Admitting: Family Medicine

## 2013-02-06 ENCOUNTER — Inpatient Hospital Stay (HOSPITAL_COMMUNITY): Payer: Medicare Other

## 2013-02-06 DIAGNOSIS — I10 Essential (primary) hypertension: Secondary | ICD-10-CM

## 2013-02-06 DIAGNOSIS — Z87891 Personal history of nicotine dependence: Secondary | ICD-10-CM

## 2013-02-06 DIAGNOSIS — Z515 Encounter for palliative care: Secondary | ICD-10-CM

## 2013-02-06 DIAGNOSIS — K55059 Acute (reversible) ischemia of intestine, part and extent unspecified: Principal | ICD-10-CM

## 2013-02-06 DIAGNOSIS — R52 Pain, unspecified: Secondary | ICD-10-CM

## 2013-02-06 DIAGNOSIS — K922 Gastrointestinal hemorrhage, unspecified: Secondary | ICD-10-CM

## 2013-02-06 DIAGNOSIS — E785 Hyperlipidemia, unspecified: Secondary | ICD-10-CM

## 2013-02-06 DIAGNOSIS — R6889 Other general symptoms and signs: Secondary | ICD-10-CM | POA: Diagnosis present

## 2013-02-06 DIAGNOSIS — R5381 Other malaise: Secondary | ICD-10-CM | POA: Diagnosis present

## 2013-02-06 DIAGNOSIS — E039 Hypothyroidism, unspecified: Secondary | ICD-10-CM

## 2013-02-06 DIAGNOSIS — E43 Unspecified severe protein-calorie malnutrition: Secondary | ICD-10-CM | POA: Diagnosis present

## 2013-02-06 DIAGNOSIS — R109 Unspecified abdominal pain: Secondary | ICD-10-CM

## 2013-02-06 DIAGNOSIS — E119 Type 2 diabetes mellitus without complications: Secondary | ICD-10-CM

## 2013-02-06 DIAGNOSIS — R531 Weakness: Secondary | ICD-10-CM

## 2013-02-06 DIAGNOSIS — Z79899 Other long term (current) drug therapy: Secondary | ICD-10-CM

## 2013-02-06 DIAGNOSIS — F0280 Dementia in other diseases classified elsewhere without behavioral disturbance: Secondary | ICD-10-CM | POA: Diagnosis present

## 2013-02-06 DIAGNOSIS — F411 Generalized anxiety disorder: Secondary | ICD-10-CM | POA: Diagnosis present

## 2013-02-06 DIAGNOSIS — G309 Alzheimer's disease, unspecified: Secondary | ICD-10-CM | POA: Diagnosis present

## 2013-02-06 DIAGNOSIS — Z8673 Personal history of transient ischemic attack (TIA), and cerebral infarction without residual deficits: Secondary | ICD-10-CM

## 2013-02-06 DIAGNOSIS — IMO0002 Reserved for concepts with insufficient information to code with codable children: Secondary | ICD-10-CM

## 2013-02-06 DIAGNOSIS — R197 Diarrhea, unspecified: Secondary | ICD-10-CM

## 2013-02-06 DIAGNOSIS — Z66 Do not resuscitate: Secondary | ICD-10-CM | POA: Diagnosis present

## 2013-02-06 DIAGNOSIS — D72829 Elevated white blood cell count, unspecified: Secondary | ICD-10-CM

## 2013-02-06 DIAGNOSIS — M1990 Unspecified osteoarthritis, unspecified site: Secondary | ICD-10-CM | POA: Diagnosis present

## 2013-02-06 DIAGNOSIS — Z7982 Long term (current) use of aspirin: Secondary | ICD-10-CM

## 2013-02-06 DIAGNOSIS — R634 Abnormal weight loss: Secondary | ICD-10-CM

## 2013-02-06 DIAGNOSIS — Z794 Long term (current) use of insulin: Secondary | ICD-10-CM

## 2013-02-06 DIAGNOSIS — K529 Noninfective gastroenteritis and colitis, unspecified: Secondary | ICD-10-CM | POA: Diagnosis present

## 2013-02-06 DIAGNOSIS — K5289 Other specified noninfective gastroenteritis and colitis: Secondary | ICD-10-CM | POA: Diagnosis present

## 2013-02-06 DIAGNOSIS — D62 Acute posthemorrhagic anemia: Secondary | ICD-10-CM | POA: Diagnosis present

## 2013-02-06 DIAGNOSIS — D649 Anemia, unspecified: Secondary | ICD-10-CM

## 2013-02-06 HISTORY — DX: Type 2 diabetes mellitus without complications: E11.9

## 2013-02-06 HISTORY — DX: Alzheimer's disease, unspecified: G30.9

## 2013-02-06 HISTORY — DX: Cerebral infarction, unspecified: I63.9

## 2013-02-06 HISTORY — DX: Other bursitis of hip, unspecified hip: M70.70

## 2013-02-06 HISTORY — DX: Hypothyroidism, unspecified: E03.9

## 2013-02-06 HISTORY — DX: Essential (primary) hypertension: I10

## 2013-02-06 HISTORY — DX: Unspecified osteoarthritis, unspecified site: M19.90

## 2013-02-06 HISTORY — DX: Constipation, unspecified: K59.00

## 2013-02-06 HISTORY — DX: Dementia in other diseases classified elsewhere, unspecified severity, without behavioral disturbance, psychotic disturbance, mood disturbance, and anxiety: F02.80

## 2013-02-06 HISTORY — DX: Other specified disorders of bone density and structure, unspecified site: M85.80

## 2013-02-06 HISTORY — DX: Hyperlipidemia, unspecified: E78.5

## 2013-02-06 LAB — BASIC METABOLIC PANEL
BUN: 41 mg/dL — ABNORMAL HIGH (ref 6–23)
CO2: 27 mEq/L (ref 19–32)
Chloride: 102 mEq/L (ref 96–112)
Creatinine, Ser: 0.79 mg/dL (ref 0.50–1.10)
GFR calc Af Amer: 84 mL/min — ABNORMAL LOW (ref >90)
GFR calc non Af Amer: 73 mL/min — ABNORMAL LOW (ref >90)
Potassium: 3.6 mEq/L (ref 3.5–5.1)
Sodium: 137 mEq/L (ref 135–145)

## 2013-02-06 LAB — URINALYSIS, ROUTINE W REFLEX MICROSCOPIC
Bilirubin Urine: NEGATIVE
Ketones, ur: NEGATIVE mg/dL
Nitrite: NEGATIVE
Protein, ur: NEGATIVE mg/dL
Urobilinogen, UA: 0.2 mg/dL (ref 0.0–1.0)

## 2013-02-06 LAB — HEPATIC FUNCTION PANEL
Alkaline Phosphatase: 107 U/L (ref 39–117)
Indirect Bilirubin: 0.1 mg/dL — ABNORMAL LOW (ref 0.3–0.9)
Total Bilirubin: 0.3 mg/dL (ref 0.3–1.2)
Total Protein: 5.5 g/dL — ABNORMAL LOW (ref 6.0–8.3)

## 2013-02-06 LAB — CBC WITH DIFFERENTIAL/PLATELET
Basophils Relative: 0 % (ref 0–1)
Eosinophils Absolute: 0 10*3/uL (ref 0.0–0.7)
Eosinophils Relative: 0 % (ref 0–5)
HCT: 19.5 % — ABNORMAL LOW (ref 36.0–46.0)
Hemoglobin: 6.5 g/dL — CL (ref 12.0–15.0)
Lymphocytes Relative: 3 % — ABNORMAL LOW (ref 12–46)
MCHC: 33.3 g/dL (ref 30.0–36.0)
Neutro Abs: 32.6 10*3/uL — ABNORMAL HIGH (ref 1.7–7.7)
RBC: 2.11 MIL/uL — ABNORMAL LOW (ref 3.87–5.11)

## 2013-02-06 LAB — ABO/RH: ABO/RH(D): A POS

## 2013-02-06 LAB — GLUCOSE, CAPILLARY
Glucose-Capillary: 107 mg/dL — ABNORMAL HIGH (ref 70–99)
Glucose-Capillary: 158 mg/dL — ABNORMAL HIGH (ref 70–99)
Glucose-Capillary: 146 mg/dL — ABNORMAL HIGH (ref 70–99)

## 2013-02-06 LAB — HEMOGLOBIN: Hemoglobin: 8.7 g/dL — ABNORMAL LOW (ref 12.0–15.0)

## 2013-02-06 MED ORDER — ONDANSETRON HCL 4 MG/2ML IJ SOLN: 4.0000 mg | Freq: Four times a day (QID) | INTRAMUSCULAR | Status: DC | PRN

## 2013-02-06 MED ORDER — INSULIN ASPART 100 UNIT/ML ~~LOC~~ SOLN
0.0000 [IU] | SUBCUTANEOUS | Status: DC
  Administered 2013-02-06: 3 [IU] via SUBCUTANEOUS
  Administered 2013-02-06 – 2013-02-07 (×6): 2 [IU] via SUBCUTANEOUS

## 2013-02-06 MED ORDER — SODIUM CHLORIDE 0.9 % IV SOLN
1000.0000 mL | INTRAVENOUS | Status: DC
  Administered 2013-02-06: 1000 mL via INTRAVENOUS

## 2013-02-06 MED ORDER — PIPERACILLIN-TAZOBACTAM 3.375 G IVPB
3.3750 g | Freq: Three times a day (TID) | INTRAVENOUS | Status: DC
  Administered 2013-02-06 – 2013-02-08 (×7): 3.375 g via INTRAVENOUS
  Filled 2013-02-06 (×8): qty 50

## 2013-02-06 MED ORDER — ACETAMINOPHEN 325 MG PO TABS: 650.0000 mg | ORAL_TABLET | Freq: Four times a day (QID) | ORAL | Status: DC | PRN

## 2013-02-06 MED ORDER — ONDANSETRON HCL 4 MG PO TABS: 4.0000 mg | ORAL_TABLET | Freq: Four times a day (QID) | ORAL | Status: DC | PRN

## 2013-02-06 MED ORDER — ACETAMINOPHEN 650 MG RE SUPP: 650.0000 mg | Freq: Four times a day (QID) | RECTAL | Status: DC | PRN

## 2013-02-06 MED ORDER — OXYCODONE HCL 5 MG PO TABS: 5.0000 mg | ORAL_TABLET | ORAL | Status: DC | PRN

## 2013-02-06 MED ORDER — GADOBENATE DIMEGLUMINE 529 MG/ML IV SOLN
13.0000 mL | Freq: Once | INTRAVENOUS | Status: AC | PRN
  Administered 2013-02-06: 13 mL via INTRAVENOUS

## 2013-02-06 MED ORDER — INSULIN GLARGINE 100 UNITS/ML SOLOSTAR PEN: 8.0000 [IU] | PEN_INJECTOR | Freq: Every day | SUBCUTANEOUS | Status: DC

## 2013-02-06 MED ORDER — INSULIN GLARGINE 100 UNIT/ML ~~LOC~~ SOLN
8.0000 [IU] | Freq: Every day | SUBCUTANEOUS | Status: DC
  Administered 2013-02-06 – 2013-02-07 (×2): 8 [IU] via SUBCUTANEOUS
  Filled 2013-02-06 (×3): qty 0.08

## 2013-02-06 MED ORDER — LISINOPRIL 20 MG PO TABS
20.0000 mg | ORAL_TABLET | Freq: Every day | ORAL | Status: DC
  Administered 2013-02-06 – 2013-02-08 (×3): 20 mg via ORAL
  Filled 2013-02-06 (×4): qty 1

## 2013-02-06 MED ORDER — IOHEXOL 300 MG/ML  SOLN
50.0000 mL | Freq: Once | INTRAMUSCULAR | Status: AC | PRN
  Administered 2013-02-06: 50 mL via ORAL

## 2013-02-06 MED ORDER — LEVOTHYROXINE SODIUM 100 MCG PO TABS
100.0000 ug | ORAL_TABLET | Freq: Every day | ORAL | Status: DC
  Administered 2013-02-07 – 2013-02-08 (×2): 100 ug via ORAL
  Filled 2013-02-06 (×5): qty 1

## 2013-02-06 MED ORDER — MORPHINE SULFATE 2 MG/ML IJ SOLN
2.0000 mg | INTRAMUSCULAR | Status: DC | PRN
  Administered 2013-02-07 – 2013-02-09 (×7): 2 mg via INTRAVENOUS
  Filled 2013-02-06 (×7): qty 1

## 2013-02-06 MED ORDER — DONEPEZIL HCL 10 MG PO TABS
10.0000 mg | ORAL_TABLET | Freq: Every day | ORAL | Status: DC
  Administered 2013-02-07: 10 mg via ORAL
  Filled 2013-02-06 (×3): qty 1

## 2013-02-06 MED ORDER — IOHEXOL 300 MG/ML  SOLN
100.0000 mL | Freq: Once | INTRAMUSCULAR | Status: AC | PRN
  Administered 2013-02-06: 100 mL via INTRAVENOUS

## 2013-02-06 MED ORDER — EZETIMIBE 10 MG PO TABS
10.0000 mg | ORAL_TABLET | Freq: Every day | ORAL | Status: DC
  Administered 2013-02-07 – 2013-02-08 (×2): 10 mg via ORAL
  Filled 2013-02-06 (×4): qty 1

## 2013-02-06 MED ORDER — SODIUM CHLORIDE 0.9 % IV SOLN
INTRAVENOUS | Status: DC
  Administered 2013-02-06 – 2013-02-07 (×2): via INTRAVENOUS

## 2013-02-06 MED ORDER — PIPERACILLIN-TAZOBACTAM 3.375 G IVPB
3.3750 g | Freq: Once | INTRAVENOUS | Status: AC
  Administered 2013-02-06: 3.375 g via INTRAVENOUS
  Filled 2013-02-06: qty 50

## 2013-02-06 NOTE — H&P (Addendum)
[facility] History and Physical  [name] ZOX:[id number] DOB: Sep 17, 1924 DOA: 01/21/2013  Referring physician: Dr. Marisa Severin PCP: Ginette Otto, MD  Gastroenterologist: Danise Edge, MD (did colonoscopy 10/2003) Endocrinologist: Debara Pickett, MD  Chief Complaint: Generalized weakness, RLQ abdominal pain.   History of Present Illness: [name] is an 79 y.o. female with a PMH of DM and Alzheimer's dementia who was brought to the hospital via EMS secondary to abdominal pain, weakness and black tarry stools.  The patient's son provides the relevant history, as the patient has advanced dementia and is quite hard of hearing.  He says she has been having black tarry stools for the past 2 weeks (which he thought was from taking iron supplements), and that over the past 24-48 hours, she has had RLQ abdominal pain, which he thought was gas.  He notes that her glucoses have been higher than normal, and that she has not had much to eat over the past 2 days.  She has had 2 falls over the past month, and has some bruises to the knees.  Ambulatory after the falls.  She does take a daily aspirin, but no NSAIDs.  Review of Systems: Constitutional: No fever, no chills;  Appetite diminished; + weight loss  HEENT: No blurry vision, no diplopia, no pharyngitis, no dysphagia, +HOH CV: No chest pain, no palpitations.  Resp: + SOB, no cough. GI: + nausea and vomiting, + diarrhea, + melena, no hematochezia.  GU: No dysuria, no hematuria.  MSK: no myalgias, no arthralgias.  Neuro:  No headache, no  focal neurological deficits, no history of seizures.  Psych: No depression, no anxiety.  Endo: + thyroid disease, + DM, no heat intolerance, + cold intolerance, no polyuria, no polydipsia  Skin: No rashes, +bruising to knees.  Heme: No easy bruising, no history of blood diseases.  Past Medical History Past Medical History  Diagnosis Date  . Hypothyroidism   . Diabetes mellitus without  complication   . Alzheimer disease   . Hypertension   . Hyperlipidemia   . CVA (cerebral infarction) 09/2007  . DJD (degenerative joint disease)   . Bursitis of hip     Left  . Osteopenia   . Constipation     Past Surgical History Past Surgical History  Procedure Laterality Date  . Rotator cuff surgery      Right  . Right cateract surgery       Social History: History   Social History  . Marital Status: Widowed    Spouse Name: N/A    Number of Children: 2  . Years of Education: N/A   Occupational History  . Housewife, bank teller    Social History Main Topics  . Smoking status: Former Smoker -- 10 years  . Smokeless tobacco: Not on file  . Alcohol Use: No     Comment: Not since her stroke.  . Drug Use: No  . Sexually Active: Not Currently    Birth Control/ Protection: None   Other Topics Concern  . Not on file   Social History Narrative   Widowed.  Lives with son.  Ambulates with a cane at baseline.    Family History:  Family History  Problem Relation Age of Onset  . Cancer Mother     ? Pancreatic  . Cancer Brother   . Heart failure Father   . Heart failure Brother   . Cancer Sister   . Alcoholism Son     Allergies: Review of patient's  allergies indicates no known allergies.  Meds: Prior to Admission medications   Medication Sig Start Date End Date Taking? Authorizing Provider  Acetaminophen (TYLENOL ARTHRITIS PAIN PO) Take 1,000 mg by mouth 2 (two) times daily.   Yes Historical Provider, MD  aspirin 325 MG tablet Take 325 mg by mouth daily.   Yes Historical Provider, MD  calcium-vitamin D (OSCAL WITH D) 500-200 MG-UNIT per tablet Take 1 tablet by mouth 2 (two) times daily.    Yes Historical Provider, MD  donepezil (ARICEPT) 10 MG tablet Take 10 mg by mouth at bedtime.   Yes Historical Provider, MD  ezetimibe (ZETIA) 10 MG tablet Take 10 mg by mouth daily.   Yes Historical Provider, MD  ferrous sulfate 325 (65 FE) MG tablet Take 325 mg by mouth  daily.    Yes Historical Provider, MD  Glucosamine 500 MG CAPS Take 1 capsule by mouth daily.   Yes Historical Provider, MD  insulin aspart (NOVOLOG FLEXPEN) 100 UNIT/ML injection Inject 6-13 Units into the skin 3 (three) times daily before meals. 13 units at breakfast, 6 units at lunch, 7 units at supper   Yes Historical Provider, MD  insulin glargine (LANTUS) 100 units/mL SOLN Inject 8 Units into the skin at bedtime.    Yes Historical Provider, MD  levothyroxine (SYNTHROID, LEVOTHROID) 112 MCG tablet Take 112 mcg by mouth daily.   Yes Historical Provider, MD  lisinopril (PRINIVIL,ZESTRIL) 20 MG tablet Take 20 mg by mouth daily.   Yes Historical Provider, MD  Melatonin 3 MG TABS Take 1 tablet by mouth daily.   Yes Historical Provider, MD  Multiple Vitamins-Minerals (MULTIVITAMIN PO) Take 1 tablet by mouth daily.   Yes Historical Provider, MD  Vitamin D, Ergocalciferol, (DRISDOL) 50000 UNITS CAPS Take 50,000 Units by mouth as directed. Take one capsule twice a month   On the 7th and 22nd.   Yes Historical Provider, MD    Physical Exam: Filed Vitals:   01/19/2013 1610 02/03/2013 0729 02/01/2013 0830 01/31/2013 0835  BP: 167/82 161/60    Pulse: 90 87    Temp: 98.4 F (36.9 C) 97.5 F (36.4 C) 98 F (36.7 C)   TempSrc: Axillary     Resp: 18 20    SpO2: 100% 100%  100%     Physical Exam: Blood pressure 161/60, pulse 87, temperature 98 F (36.7 C), temperature source Axillary, resp. rate 20, SpO2 100.00%. Gen: No acute distress.  Sleepy but arouses to stimulation. Head: Normocephalic, atraumatic. Eyes: PERRL, EOMI, sclerae nonicteric. Mouth: Oropharynx clear. Neck: Supple, no thyromegaly, no lymphadenopathy, no jugular venous distention. Chest: Lungs clear to auscultation bilaterally. CV: Heart sounds regular, without murmurs, rubs, or gallops. Abdomen: Soft, very tender with guarding, nondistended with normal active bowel sounds. Rectal: Done by EDP, heme + Extremities: Extremities without  clubbing, edema, or cyanosis. Skin: Warm and dry. Ecchymosis inferior to the knees bilaterally. Neuro: Lethargic, unable to assess orientation but appears confused; cranial nerves II through XII grossly intact. Moves all extremities x4 spontaneously but not cooperative with formal testing. Psych: Mood and affect flat with irritability.  Labs on Admission:  Basic Metabolic Panel:  Recent Labs Lab 01/07/2013 0305  NA 137  K 3.6  CL 102  CO2 27  GLUCOSE 99  BUN 41*  CREATININE 0.79  CALCIUM 9.1   Liver Function Tests: No results found for this basename: AST, ALT, ALKPHOS, BILITOT, PROT, ALBUMIN,  in the last 168 hours No results found for this basename: LIPASE, AMYLASE,  in  the last 168 hours No results found for this basename: AMMONIA,  in the last 168 hours CBC:  Recent Labs Lab 02/03/2013 0305  WBC 37.9*  NEUTROABS 32.6*  HGB 6.5*  HCT 19.5*  MCV 92.4  PLT 459*    Radiological Exams on Admission: Ct Abdomen Pelvis W Contrast  01/30/2013  *RADIOLOGY REPORT*  Clinical Data: Right hip and lower abdominal pain.  Anemia.  The patient fell 1 month ago.  White cell count 37.9.  CT ABDOMEN AND PELVIS WITH CONTRAST  Technique:  Multidetector CT imaging of the abdomen and pelvis was performed following the standard protocol during bolus administration of intravenous contrast.  Contrast: OMNIPAQUE IOHEXOL 300 MG/ML  SOLN  Comparison: None.  Findings: Atelectasis in the lung bases.  Minimal left pleural effusion.  Coronary artery calcifications.  The liver, spleen, gallbladder, pancreas, adrenal glands, kidneys, inferior vena cava, and retroperitoneal lymph nodes are unremarkable except for vascular calcifications.  Extensive calcification of the abdominal aorta.  Tortuous aorta.  Focal aortic calcifications at the abdominal inlet may contribute to focal stenosis.  Small bowel are mildly distended and predominately fluid-filled with suggestion of diffuse wall thickening involving the  small bowel, terminal ileum, and ascending colon.  Changes are most consistent with gastroenteritis or other inflammatory process. No pneumatosis or portal venous gas.   There are calcifications at the origin of the superior mesenteric artery and focal stenosis is not excluded. The superior mesenteric artery and vein appear patent. Correlate for clinical or laboratory evidence of early bowel ischemia.  Pelvis:  The bladder is decompressed with Foley catheter.  Uterus is normal size.  No abnormal adnexal masses.  No free or loculated pelvic fluid collections.  No significant pelvic lymphadenopathy. The appendix is not visualized.  No evidence of diverticulitis. Degenerative changes in the lumbar spine.  No displaced fractures identified.  IMPRESSION: Fluid filled nondistended small bowel with diffuse wall thickening of the small bowel.  This is likely to represent gastroenteritis or inflammatory disease.  However, there is calcification in the origin of the superior mesenteric artery and mesenteric ischemia should be excluded.  No specific signs of pneumatosis or mesenteric edema.   Original Report Authenticated By: Burman Nieves, M.D.    Dg Abd Acute W/chest  01/25/2013  *RADIOLOGY REPORT*  Clinical Data: Abdominal pain and distension.  ACUTE ABDOMEN SERIES (ABDOMEN 2 VIEW & CHEST 1 VIEW)  Comparison: Chest 05/29/2009  Findings: The patient is rotated.  Shallow inspiration with lucency in the lungs suggesting emphysema.  Normal heart size and pulmonary vascularity.  Calcified granulomas in the lungs.  No focal consolidation or airspace disease.  No blunting of costophrenic angles.  No pneumothorax.  Degenerative changes in the spine and shoulders.  Resection or resorption of the distal right clavicle.  There are multiple gas-filled loops of small bowel in the mid abdomen without distension.  There is suggestion of wall thickening and fold thickening.  Changes are most consistent with inflammatory bowel disease  versus gastroenteritis.  Gas and stool throughout the colon without distension.  No free intra-abdominal air.  No abnormal air fluid levels.  Degenerative changes in the spine.  No radiopaque stones.  Vascular calcifications.  IMPRESSION: No evidence of active pulmonary disease.  Gas filled nondistended small bowel with wall thickening suggesting infiltrative process such as gastroenteritis, edema, or inflammatory bowel disease.   Original Report Authenticated By: Burman Nieves, M.D.     EKG: Independently reviewed. Irregular rate but P waves are visible. T wave flat  or negative in aVL, V2 and V3.  Assessment/Plan Principal Problem:   GI bleed with acute blood loss anemia -Admit and give 2 units of packed red blood cells. -With known history of daily aspirin ingestion and black tarry stools, worrisome for an upper GI bleed. -Hold aspirin and maintain n.p.o. status. Gastroenterology consulted. Spoke with Dr. Ewing Schlein who recommended an MRA to rule out mesenteric ischemia and will see the patient later today. -Serial hemoglobin checks ordered. Active Problems:   Enteritis -Patient is exquisitely tender on exam. We'll empirically treat with Zosyn. -Stool studies requested including stool for C. difficile PCR and cultures.   Weight loss -Patient appears to have weight loss from changes to her appetite secondary to subacute illness. -Check TSH to ensure that she is not over replaced with regard to thyroid hormone replacement.   Hypothyroidism -Continue current dose of Synthroid pending TSH results.   Diabetes -Continue home dose of Lantus and add sliding scale insulin, moderate scale, every 4 hours while n.p.o. -Hemoglobin A1c done 01/20/2013:5.9%   Cold intolerance -May be from anemia, chills, or hypothyroidism. Check TSH, correct anemia, and monitor for fever.   Generalized weakness with fall -Likely multifactorial. Formal PT/OT evaluations when more stable.   Alzheimer's dementia -Continue  Aricept.   Hypertension -Continue lisinopril.   Hyperlipidemia -Continue Zetia.   History of CVA (cerebrovascular accident) -No deficits per her son. Hold aspirin.   Diarrhea -Check stool studies as noted above.   Leukocytosis -No evidence of UTI or pneumonia. Lactic acid not elevated which argues against bowel ischemia. -Empiric Zosyn for enteritis. -Stool studies ordered.   Code Status: DNR Family Communication: Son [name] ([phone]) Disposition Plan: Home when stable.  Time spent: 1 hour.  Damein Gaunce [facility] Pager [phone]  If 7PM-7AM, please contact night-coverage [email or web address] Password [facility] 01/21/2013, 8:48 AM

## 2013-02-06 NOTE — ED Notes (Signed)
Report per EMS: Pt fell x 1 month ago, pain to R hip w/ palpation, possible shortening and rotation. Pt's son reported that pt has been consuming nothing but ice x 2 days. Pt has hx of alzheimers and is a poor historian. Son is also a poor historian and unable to determine primary complaint based upon pt and caregiver (son) report to EMS.

## 2013-02-06 NOTE — ED Notes (Signed)
Darral Dash, RN on floor will collect a CBG upon arrival per handoff communication.

## 2013-02-06 NOTE — Consult Note (Signed)
Reason for Consult: Abdominal pain anemia guaiac positivity Referring Physician: Hospital team  [name] is an 79 y.o. female.  HPI: Patient seen at the request of the hospital team for above medical problem and her hospital and office computer charts were reviewed and the case was discussed with the hospital Dr. and she is unable to give any history because of her dementia and her CT was reviewed and an MRA is pending and she was found to be iron deficient in her primary doctor's office in the fall of 2012 and her ferritin increased on iron on recheck however no additional recent CBC was available and no obvious previous GI workup has been done and unfortunately her son is not available currently for additional history  Past Medical History  Diagnosis Date  . Hypothyroidism   . Diabetes mellitus without complication   . Alzheimer disease   . Hypertension   . Hyperlipidemia   . CVA (cerebral infarction) 09/2007  . DJD (degenerative joint disease)   . Bursitis of hip     Left  . Osteopenia   . Constipation     Past Surgical History  Procedure Laterality Date  . Rotator cuff surgery      Right  . Right cateract surgery      Family History  Problem Relation Age of Onset  . Cancer Mother     ? Pancreatic  . Cancer Brother   . Heart failure Father   . Heart failure Brother   . Cancer Sister   . Alcoholism Son     Social History:  reports that she has quit smoking. Her smoking use included Cigarettes. She smoked 0.00 packs per day for 10 years. She has never used smokeless tobacco. She reports that she does not drink alcohol or use illicit drugs.  Allergies: No Known Allergies  Medications: I have reviewed the patient's current medications.  Results for orders placed during the hospital encounter of 01/14/2013 (from the past 48 hour(s))  CBC WITH DIFFERENTIAL     Status: Abnormal   Collection Time    01/08/2013  3:05 AM      Result Value Range   WBC 37.9 (*) 4.0 -  10.5 K/uL   RBC 2.11 (*) 3.87 - 5.11 MIL/uL   Hemoglobin 6.5 (*) 12.0 - 15.0 g/dL   Comment: REPEATED TO VERIFY     CRITICAL RESULT CALLED TO, READ BACK BY AND VERIFIED WITH:     JENNIFER MAYHALL,RN [id number] @ 0335 BY J SCOTTON   HCT 19.5 (*) 36.0 - 46.0 %   MCV 92.4  78.0 - 100.0 fL   MCH 30.8  26.0 - 34.0 pg   MCHC 33.3  30.0 - 36.0 g/dL   RDW 11.9  14.7 - 82.9 %   Platelets 459 (*) 150 - 400 K/uL   Neutrophils Relative 86 (*) 43 - 77 %   Lymphocytes Relative 3 (*) 12 - 46 %   Monocytes Relative 11  3 - 12 %   Eosinophils Relative 0  0 - 5 %   Basophils Relative 0  0 - 1 %   Neutro Abs 32.6 (*) 1.7 - 7.7 K/uL   Lymphs Abs 1.1  0.7 - 4.0 K/uL   Monocytes Absolute 4.2 (*) 0.1 - 1.0 K/uL   Eosinophils Absolute 0.0  0.0 - 0.7 K/uL   Basophils Absolute 0.0  0.0 - 0.1 K/uL   RBC Morphology RARE NRBCs     Comment: POLYCHROMASIA PRESENT   WBC  Morphology WHITE COUNT CONFIRMED ON SMEAR    BASIC METABOLIC PANEL     Status: Abnormal   Collection Time    02/01/2013  3:05 AM      Result Value Range   Sodium 137  135 - 145 mEq/L   Potassium 3.6  3.5 - 5.1 mEq/L   Chloride 102  96 - 112 mEq/L   CO2 27  19 - 32 mEq/L   Glucose, Bld 99  70 - 99 mg/dL   BUN 41 (*) 6 - 23 mg/dL   Creatinine, Ser 7.82  0.50 - 1.10 mg/dL   Calcium 9.1  8.4 - 95.6 mg/dL   GFR calc non Af Amer 73 (*) >90 mL/min   GFR calc Af Amer 84 (*) >90 mL/min   Comment:            The eGFR has been calculated     using the CKD EPI equation.     This calculation has not been     validated in all clinical     situations.     eGFR's persistently     <90 mL/min signify     possible Chronic Kidney Disease.  LACTIC ACID, PLASMA     Status: None   Collection Time    01/10/2013  3:05 AM      Result Value Range   Lactic Acid, Venous 1.1  0.5 - 2.2 mmol/L  SAMPLE TO BLOOD BANK     Status: None   Collection Time    02/03/2013  3:05 AM      Result Value Range   Blood Bank Specimen SAMPLE AVAILABLE FOR TESTING     Sample  Expiration 02/09/2013    TYPE AND SCREEN     Status: None   Collection Time    02/05/2013  3:05 AM      Result Value Range   ABO/RH(D) A POS     Antibody Screen NEG     Sample Expiration 02/09/2013     Unit Number [id number]     Blood Component Type RED CELLS,LR     Unit division 00     Status of Unit ALLOCATED     Transfusion Status OK TO TRANSFUSE     Crossmatch Result Compatible     Unit Number [id number]     Blood Component Type RED CELLS,LR     Unit division 00     Status of Unit ISSUED     Transfusion Status OK TO TRANSFUSE     Crossmatch Result Compatible    ABO/RH     Status: None   Collection Time    01/17/2013  3:05 AM      Result Value Range   ABO/RH(D) A POS    URINALYSIS, ROUTINE W REFLEX MICROSCOPIC     Status: Abnormal   Collection Time    01/20/2013  4:00 AM      Result Value Range   Color, Urine YELLOW  YELLOW   APPearance CLOUDY (*) CLEAR   Specific Gravity, Urine 1.027  1.005 - 1.030   pH 6.0  5.0 - 8.0   Glucose, UA 250 (*) NEGATIVE mg/dL   Hgb urine dipstick NEGATIVE  NEGATIVE   Bilirubin Urine NEGATIVE  NEGATIVE   Ketones, ur NEGATIVE  NEGATIVE mg/dL   Protein, ur NEGATIVE  NEGATIVE mg/dL   Urobilinogen, UA 0.2  0.0 - 1.0 mg/dL   Nitrite NEGATIVE  NEGATIVE   Leukocytes, UA NEGATIVE  NEGATIVE   Comment: MICROSCOPIC NOT DONE  ON URINES WITH NEGATIVE PROTEIN, BLOOD, LEUKOCYTES, NITRITE, OR GLUCOSE <1000 mg/dL.  OCCULT BLOOD, POC DEVICE     Status: Abnormal   Collection Time    03-07-2013  4:17 AM      Result Value Range   Fecal Occult Bld POSITIVE (*) NEGATIVE  PREPARE RBC (CROSSMATCH)     Status: None   Collection Time    03/07/2013  4:30 AM      Result Value Range   Order Confirmation ORDER PROCESSED BY BLOOD BANK      Ct Abdomen Pelvis W Contrast  03-07-2013  *RADIOLOGY REPORT*  Clinical Data: Right hip and lower abdominal pain.  Anemia.  The patient fell 1 month ago.  White cell count 37.9.  CT ABDOMEN AND PELVIS WITH CONTRAST  Technique:   Multidetector CT imaging of the abdomen and pelvis was performed following the standard protocol during bolus administration of intravenous contrast.  Contrast: OMNIPAQUE IOHEXOL 300 MG/ML  SOLN  Comparison: None.  Findings: Atelectasis in the lung bases.  Minimal left pleural effusion.  Coronary artery calcifications.  The liver, spleen, gallbladder, pancreas, adrenal glands, kidneys, inferior vena cava, and retroperitoneal lymph nodes are unremarkable except for vascular calcifications.  Extensive calcification of the abdominal aorta.  Tortuous aorta.  Focal aortic calcifications at the abdominal inlet may contribute to focal stenosis.  Small bowel are mildly distended and predominately fluid-filled with suggestion of diffuse wall thickening involving the small bowel, terminal ileum, and ascending colon.  Changes are most consistent with gastroenteritis or other inflammatory process. No pneumatosis or portal venous gas.   There are calcifications at the origin of the superior mesenteric artery and focal stenosis is not excluded. The superior mesenteric artery and vein appear patent. Correlate for clinical or laboratory evidence of early bowel ischemia.  Pelvis:  The bladder is decompressed with Foley catheter.  Uterus is normal size.  No abnormal adnexal masses.  No free or loculated pelvic fluid collections.  No significant pelvic lymphadenopathy. The appendix is not visualized.  No evidence of diverticulitis. Degenerative changes in the lumbar spine.  No displaced fractures identified.  IMPRESSION: Fluid filled nondistended small bowel with diffuse wall thickening of the small bowel.  This is likely to represent gastroenteritis or inflammatory disease.  However, there is calcification in the origin of the superior mesenteric artery and mesenteric ischemia should be excluded.  No specific signs of pneumatosis or mesenteric edema.   Original Report Authenticated By: Burman Nieves, M.D.    Dg Abd Acute  W/chest  03-07-2013  *RADIOLOGY REPORT*  Clinical Data: Abdominal pain and distension.  ACUTE ABDOMEN SERIES (ABDOMEN 2 VIEW & CHEST 1 VIEW)  Comparison: Chest 05/29/2009  Findings: The patient is rotated.  Shallow inspiration with lucency in the lungs suggesting emphysema.  Normal heart size and pulmonary vascularity.  Calcified granulomas in the lungs.  No focal consolidation or airspace disease.  No blunting of costophrenic angles.  No pneumothorax.  Degenerative changes in the spine and shoulders.  Resection or resorption of the distal right clavicle.  There are multiple gas-filled loops of small bowel in the mid abdomen without distension.  There is suggestion of wall thickening and fold thickening.  Changes are most consistent with inflammatory bowel disease versus gastroenteritis.  Gas and stool throughout the colon without distension.  No free intra-abdominal air.  No abnormal air fluid levels.  Degenerative changes in the spine.  No radiopaque stones.  Vascular calcifications.  IMPRESSION: No evidence of active pulmonary disease.  Gas  filled nondistended small bowel with wall thickening suggesting infiltrative process such as gastroenteritis, edema, or inflammatory bowel disease.   Original Report Authenticated By: Burman Nieves, M.D.     ROS unobtainable Blood pressure 153/68, pulse 88, temperature 97.5 F (36.4 C), temperature source Axillary, resp. rate 18, SpO2 100.00%. Physical Exam patient is asleep lying comfortable in the bed vital signs stable afebrile and her abdomen is soft but tender everywhere and she would not let me listen with my stethoscope labs reviewed worrisome white count and hemoglobin and slight increased BUN but not acidotic Assessment/Plan: Multiple medical problem currently with exacerbation of chronic anemia and guaiac positive on aspirin and abdominal pain Plan: Will follow with you and await MRI results and if positive will need to consult vascular surgery versus  interventional radiology doubt patient is a candidate for endoscopic studies and depending on above may just recommend stopping aspirin and transfusion and happy to proceed if truly need Taysha Majewski E 2013/02/26, 11:31 AM

## 2013-02-06 NOTE — Progress Notes (Signed)
ANTIBIOTIC CONSULT NOTE - INITIAL  Pharmacy Consult for Zosyn Indication: enteritis  No Known Allergies  Patient Measurements:    Vital Signs: Temp: 98 F (36.7 C) (03/31 0915) Temp src: Axillary (03/31 0624) BP: 161/60 mmHg (03/31 0729) Pulse Rate: 87 (03/31 0729) Intake/Output from previous day:   Intake/Output from this shift: Total I/O In: 350 [I.V.:350] Out: 250 [Urine:250]  Labs:  Recent Labs  01/16/2013 0305  WBC 37.9*  HGB 6.5*  PLT 459*  CREATININE 0.79   CrCl is unknown because there is no height on file for the current visit. No results found for this basename: VANCOTROUGH, VANCOPEAK, VANCORANDOM, GENTTROUGH, GENTPEAK, GENTRANDOM, TOBRATROUGH, TOBRAPEAK, TOBRARND, AMIKACINPEAK, AMIKACINTROU, AMIKACIN,  in the last 72 hours   Microbiology: No results found for this or any previous visit (from the past 720 hour(s)).  Medical History: Past Medical History  Diagnosis Date  . Hypothyroidism   . Diabetes mellitus without complication   . Alzheimer disease   . Hypertension   . Hyperlipidemia   . CVA (cerebral infarction) 09/2007  . DJD (degenerative joint disease)   . Bursitis of hip     Left  . Osteopenia   . Constipation      Assessment: 75 YOF presenting with black tarry stools concerning for UGIB.  Pt also c/o abdominal tenderness and pharmacy requested to assist with dosing for emperic Zosyn for enteritis with leukocytosis (WBC 37.9).  SCr WNL.  Currently afebrile.  Stool culture and C.diff by PCR ordered.  Goal of Therapy:  eradication of infection, dosing adjusted per renal clearance  Plan:  Zosyn 3.375g IV q8h (4 hour infusion time).   Clance Boll 01/11/2013,10:59 AM

## 2013-02-06 NOTE — ED Provider Notes (Signed)
History     CSN: [id number]  Arrival date & time 03-08-13  0207   First MD Initiated Contact with Patient March 08, 2013 0211      Chief Complaint  Patient presents with  . Hip Pain  . Abdominal Pain    (Consider location/radiation/quality/duration/timing/severity/associated sxs/prior treatment) HPI 79 year old female presents to emergency room via EMS from her home due to generalized weakness and complaint of abdominal pain.  Patient has dementia, and is very hard of hearing.  She is unable to give history.  Patient lives with her son, who gives some history but he is also a poor historian.  He reports that she had a check up with her doctor, Dr. Sharl Ma on the fourth of this month.  At that time, her health was good.  He has noted some black, tarry stools over the last 2-3 weeks.  He has attributed this to the iron that she is taking.  Over the last 24-48 hours, patient has been complaining of abdominal pain.  Patient's son assumed this was gas pain.  She has not eaten anything for the last 2 days other than ice chips.  No vomiting.  She has had loose black stools.  Patient had 2 falls in the last month.  Patient has been ambulatory after each fall. Past Medical History  Diagnosis Date  . Thyroid disease   . Diabetes mellitus without complication   . Alzheimer disease   . Hypertension   . Hyperlipidemia     History reviewed. No pertinent past surgical history.  History reviewed. No pertinent family history.  History  Substance Use Topics  . Smoking status: Not on file  . Smokeless tobacco: Not on file  . Alcohol Use: No    OB History   Grav Para Term Preterm Abortions TAB SAB Ect Mult Living                  Review of Systems  Unable to perform ROS: Dementia    Allergies  Review of patient's allergies indicates no known allergies.  Home Medications   Current Outpatient Rx  Name  Route  Sig  Dispense  Refill  . Acetaminophen (TYLENOL ARTHRITIS PAIN PO)   Oral   Take  1,000 mg by mouth 2 (two) times daily.         Marland Kitchen aspirin 325 MG tablet   Oral   Take 325 mg by mouth daily.         . calcium-vitamin D (OSCAL WITH D) 500-200 MG-UNIT per tablet   Oral   Take 1 tablet by mouth 2 (two) times daily.          Marland Kitchen donepezil (ARICEPT) 10 MG tablet   Oral   Take 10 mg by mouth at bedtime.         Marland Kitchen ezetimibe (ZETIA) 10 MG tablet   Oral   Take 10 mg by mouth daily.         . ferrous sulfate 325 (65 FE) MG tablet   Oral   Take 325 mg by mouth daily.          . Glucosamine 500 MG CAPS   Oral   Take 1 capsule by mouth daily.         . insulin aspart (NOVOLOG FLEXPEN) 100 UNIT/ML injection   Subcutaneous   Inject 6-13 Units into the skin 3 (three) times daily before meals. 13 units at breakfast, 6 units at lunch, 7 units at supper         .  insulin glargine (LANTUS) 100 units/mL SOLN   Subcutaneous   Inject 8 Units into the skin at bedtime.          Marland Kitchen levothyroxine (SYNTHROID, LEVOTHROID) 112 MCG tablet   Oral   Take 112 mcg by mouth daily.         Marland Kitchen lisinopril (PRINIVIL,ZESTRIL) 20 MG tablet   Oral   Take 20 mg by mouth daily.         . Melatonin 3 MG TABS   Oral   Take 1 tablet by mouth daily.         . Multiple Vitamins-Minerals (MULTIVITAMIN PO)   Oral   Take 1 tablet by mouth daily.         . Vitamin D, Ergocalciferol, (DRISDOL) 50000 UNITS CAPS   Oral   Take 50,000 Units by mouth as directed. Take one capsule twice a month   On the 7th and 22nd.           BP 163/49  Pulse 86  Temp(Src) 99.5 F (37.5 C) (Rectal)  Resp 20  SpO2 97%  Physical Exam  Constitutional: No distress.  Frail, weak, pale female in no acute distress, ill-appearing  HENT:  Head: Normocephalic and atraumatic.  Dry mucous membranes  Eyes: Conjunctivae and EOM are normal. Pupils are equal, round, and reactive to light. No scleral icterus.  Pale conjunctiva  Neck: Normal range of motion. Neck supple. No JVD present. No  tracheal deviation present. No thyromegaly present.  Cardiovascular: Normal rate, regular rhythm, normal heart sounds and intact distal pulses.  Exam reveals no gallop and no friction rub.   No murmur heard. Pulmonary/Chest: Effort normal and breath sounds normal. No stridor. No respiratory distress. She has no wheezes. She has no rales. She exhibits no tenderness.  Abdominal: Soft. She exhibits no distension and no mass. There is tenderness (diffuse tenderness throughout the abdomen, worse in the right lower quadrant). There is no rebound and no guarding.  Musculoskeletal: She exhibits no edema and no tenderness.  Pain with manipulation of the hips bilaterally  Lymphadenopathy:    She has no cervical adenopathy.  Neurological:  She will open eyes, complains of pain  Skin: Skin is warm and dry. No rash noted. She is not diaphoretic. No erythema. There is pallor.    ED Course  Procedures (including critical care time)  CRITICAL CARE Performed by: Olivia Mackie   Total critical care time: 60 min  Critical care time was exclusive of separately billable procedures and treating other patients.  Critical care was necessary to treat or prevent imminent or life-threatening deterioration.  Critical care was time spent personally by me on the following activities: development of treatment plan with patient and/or surrogate as well as nursing, discussions with consultants, evaluation of patient's response to treatment, examination of patient, obtaining history from patient or surrogate, ordering and performing treatments and interventions, ordering and review of laboratory studies, ordering and review of radiographic studies, pulse oximetry and re-evaluation of patient's condition.  Pt with significant anemia, signs of GI bleed.  Requires blood transfusion.  Pt's son updated, he wishes pt to be made DNR.   Labs Reviewed  CBC WITH DIFFERENTIAL - Abnormal; Notable for the following:    WBC 37.9 (*)     RBC 2.11 (*)    Hemoglobin 6.5 (*)    HCT 19.5 (*)    Platelets 459 (*)    Neutrophils Relative 86 (*)    Lymphocytes Relative 3 (*)  Neutro Abs 32.6 (*)    Monocytes Absolute 4.2 (*)    All other components within normal limits  BASIC METABOLIC PANEL - Abnormal; Notable for the following:    BUN 41 (*)    GFR calc non Af Amer 73 (*)    GFR calc Af Amer 84 (*)    All other components within normal limits  URINALYSIS, ROUTINE W REFLEX MICROSCOPIC - Abnormal; Notable for the following:    APPearance CLOUDY (*)    Glucose, UA 250 (*)    All other components within normal limits  LACTIC ACID, PLASMA  SAMPLE TO BLOOD BANK  PREPARE RBC (CROSSMATCH)  TYPE AND SCREEN  ABO/RH   Dg Abd Acute W/chest  03/05/13  *RADIOLOGY REPORT*  Clinical Data: Abdominal pain and distension.  ACUTE ABDOMEN SERIES (ABDOMEN 2 VIEW & CHEST 1 VIEW)  Comparison: Chest 05/29/2009  Findings: The patient is rotated.  Shallow inspiration with lucency in the lungs suggesting emphysema.  Normal heart size and pulmonary vascularity.  Calcified granulomas in the lungs.  No focal consolidation or airspace disease.  No blunting of costophrenic angles.  No pneumothorax.  Degenerative changes in the spine and shoulders.  Resection or resorption of the distal right clavicle.  There are multiple gas-filled loops of small bowel in the mid abdomen without distension.  There is suggestion of wall thickening and fold thickening.  Changes are most consistent with inflammatory bowel disease versus gastroenteritis.  Gas and stool throughout the colon without distension.  No free intra-abdominal air.  No abnormal air fluid levels.  Degenerative changes in the spine.  No radiopaque stones.  Vascular calcifications.  IMPRESSION: No evidence of active pulmonary disease.  Gas filled nondistended small bowel with wall thickening suggesting infiltrative process such as gastroenteritis, edema, or inflammatory bowel disease.   Original Report  Authenticated By: Burman Nieves, M.D.     Date: Mar 05, 2013  Rate: 90  Rhythm: normal sinus rhythm and premature atrial contractions (PAC)  QRS Axis: normal  Intervals: normal  ST/T Wave abnormalities: normal  Conduction Disutrbances:none  Narrative Interpretation:   Old EKG Reviewed: unchanged    1. Abdominal pain, acute   2. Anemia   3. GI bleed   4. Weakness       MDM  79 yo female with weakness, abd pain.  Noted to have significant anemia, heme positive stools, and very elevated WBC.  D/w son, who reports he is HCPOA.  He is requesting DNR should she have code situation.  He will bring the paperwork in when he returns.  Will start abx, get CT abd pelvis, blood transfusion.  Will require admission.        Olivia Mackie, MD 2013/03/05 [phone]

## 2013-02-06 NOTE — ED Notes (Signed)
Pt to CT

## 2013-02-06 NOTE — ED Notes (Signed)
Pt returned to room  

## 2013-02-06 NOTE — ED Notes (Addendum)
MD at bedside.  Dr. Trula Ore Rama [phone]) ordered to not have a NS infusion during blood administration and to restart infusion at 100cc/hr.

## 2013-02-07 DIAGNOSIS — D649 Anemia, unspecified: Secondary | ICD-10-CM

## 2013-02-07 DIAGNOSIS — K55059 Acute (reversible) ischemia of intestine, part and extent unspecified: Secondary | ICD-10-CM | POA: Diagnosis present

## 2013-02-07 DIAGNOSIS — E119 Type 2 diabetes mellitus without complications: Secondary | ICD-10-CM

## 2013-02-07 DIAGNOSIS — G309 Alzheimer's disease, unspecified: Secondary | ICD-10-CM

## 2013-02-07 DIAGNOSIS — K922 Gastrointestinal hemorrhage, unspecified: Secondary | ICD-10-CM

## 2013-02-07 DIAGNOSIS — F0280 Dementia in other diseases classified elsewhere without behavioral disturbance: Secondary | ICD-10-CM

## 2013-02-07 DIAGNOSIS — R109 Unspecified abdominal pain: Secondary | ICD-10-CM

## 2013-02-07 LAB — BASIC METABOLIC PANEL
BUN: 31 mg/dL — ABNORMAL HIGH (ref 6–23)
Creatinine, Ser: 0.82 mg/dL (ref 0.50–1.10)
GFR calc Af Amer: 72 mL/min — ABNORMAL LOW (ref >90)
GFR calc non Af Amer: 63 mL/min — ABNORMAL LOW (ref >90)

## 2013-02-07 LAB — CBC
HCT: 24.9 % — ABNORMAL LOW (ref 36.0–46.0)
HCT: 25.7 % — ABNORMAL LOW (ref 36.0–46.0)
Hemoglobin: 8.6 g/dL — ABNORMAL LOW (ref 12.0–15.0)
MCH: 29.2 pg (ref 26.0–34.0)
MCHC: 34.5 g/dL (ref 30.0–36.0)
Platelets: 343 10*3/uL (ref 150–400)
Platelets: 347 10*3/uL (ref 150–400)
RBC: 2.88 MIL/uL — ABNORMAL LOW (ref 3.87–5.11)
RBC: 2.95 MIL/uL — ABNORMAL LOW (ref 3.87–5.11)
RDW: 19.6 % — ABNORMAL HIGH (ref 11.5–15.5)
RDW: 19.7 % — ABNORMAL HIGH (ref 11.5–15.5)
WBC: 30.9 10*3/uL — ABNORMAL HIGH (ref 4.0–10.5)
WBC: 31.2 10*3/uL — ABNORMAL HIGH (ref 4.0–10.5)

## 2013-02-07 LAB — TYPE AND SCREEN
ABO/RH(D): A POS
Antibody Screen: NEGATIVE
Unit division: 0

## 2013-02-07 LAB — GLUCOSE, CAPILLARY
Glucose-Capillary: 136 mg/dL — ABNORMAL HIGH (ref 70–99)
Glucose-Capillary: 122 mg/dL — ABNORMAL HIGH (ref 70–99)
Glucose-Capillary: 127 mg/dL — ABNORMAL HIGH (ref 70–99)

## 2013-02-07 LAB — HEMOGLOBIN: Hemoglobin: 8.1 g/dL — ABNORMAL LOW (ref 12.0–15.0)

## 2013-02-07 LAB — T4, FREE: Free T4: 0.68 ng/dL — ABNORMAL LOW (ref 0.80–1.80)

## 2013-02-07 MED ORDER — DEXTROSE 50 % IV SOLN
INTRAVENOUS | Status: AC
  Administered 2013-02-07: 25 mL
  Filled 2013-02-07: qty 50

## 2013-02-07 MED ORDER — POTASSIUM CHLORIDE 10 MEQ/100ML IV SOLN
10.0000 meq | INTRAVENOUS | Status: AC
  Administered 2013-02-07 (×4): 10 meq via INTRAVENOUS
  Filled 2013-02-07 (×4): qty 100

## 2013-02-07 MED ORDER — PANTOPRAZOLE SODIUM 40 MG IV SOLR
40.0000 mg | Freq: Two times a day (BID) | INTRAVENOUS | Status: DC
  Administered 2013-02-07 – 2013-02-10 (×7): 40 mg via INTRAVENOUS
  Filled 2013-02-07 (×8): qty 40

## 2013-02-07 MED ORDER — POTASSIUM CHLORIDE CRYS ER 20 MEQ PO TBCR
40.0000 meq | EXTENDED_RELEASE_TABLET | Freq: Once | ORAL | Status: DC
  Filled 2013-02-07: qty 2

## 2013-02-07 NOTE — Progress Notes (Signed)
[facility] Progress Note  Subjective: Patient resting comfortably. HGB up. Noted that she was not on a PPI.  Objective: Vital signs in last 24 hours: Temp:  [97.9 F (36.6 C)-99.2 F (37.3 C)] 98.1 F (36.7 C) (04/01 0529) Pulse Rate:  [82-91] 82 (04/01 0529) Resp:  [16-18] 18 (04/01 0529) BP: (134-166)/(54-76) 134/54 mmHg (04/01 0529) SpO2:  [97 %-100 %] 97 % (04/01 0529) Weight change:    PE Abdomen soft, but days it hurts when palpated. Lab Results: Results for orders placed during the hospital encounter of 01/08/2013 (from the past 24 hour(s))  GLUCOSE, CAPILLARY     Status: Abnormal   Collection Time    02/05/2013 11:17 AM      Result Value Range   Glucose-Capillary 107 (*) 70 - 99 mg/dL   Comment 1 Notify RN    GLUCOSE, CAPILLARY     Status: Abnormal   Collection Time    01/24/2013  4:34 PM      Result Value Range   Glucose-Capillary 158 (*) 70 - 99 mg/dL   Comment 1 Notify RN    HEMOGLOBIN     Status: Abnormal   Collection Time    01/23/2013  6:33 PM      Result Value Range   Hemoglobin 8.7 (*) 12.0 - 15.0 g/dL  TSH     Status: Abnormal   Collection Time    01/29/2013  6:33 PM      Result Value Range   TSH 6.071 (*) 0.350 - 4.500 uIU/mL  GLUCOSE, CAPILLARY     Status: Abnormal   Collection Time    02/02/2013  8:37 PM      Result Value Range   Glucose-Capillary 146 (*) 70 - 99 mg/dL  GLUCOSE, CAPILLARY     Status: Abnormal   Collection Time    02/07/13  1:04 AM      Result Value Range   Glucose-Capillary 121 (*) 70 - 99 mg/dL   Comment 1 Notify RN    BASIC METABOLIC PANEL     Status: Abnormal   Collection Time    02/07/13  2:55 AM      Result Value Range   Sodium 140  135 - 145 mEq/L   Potassium 3.0 (*) 3.5 - 5.1 mEq/L   Chloride 108  96 - 112 mEq/L   CO2 26  19 - 32 mEq/L   Glucose, Bld 75  70 - 99 mg/dL   BUN 31 (*) 6 - 23 mg/dL   Creatinine, Ser 1.61  0.50 - 1.10 mg/dL   Calcium 7.9 (*) 8.4 - 10.5 mg/dL   GFR calc non Af Amer 63 (*) >90  mL/min   GFR calc Af Amer 72 (*) >90 mL/min  CBC     Status: Abnormal   Collection Time    02/07/13  2:55 AM      Result Value Range   WBC 30.9 (*) 4.0 - 10.5 K/uL   RBC 2.81 (*) 3.87 - 5.11 MIL/uL   Hemoglobin 8.2 (*) 12.0 - 15.0 g/dL   HCT 09.6 (*) 04.5 - 40.9 %   MCV 84.5  78.0 - 100.0 fL   MCH 29.2  26.0 - 34.0 pg   MCHC 34.5  30.0 - 36.0 g/dL   RDW 81.1 (*) 91.4 - 78.2 %   Platelets 343  150 - 400 K/uL  GLUCOSE, CAPILLARY     Status: Abnormal   Collection Time    02/07/13  4:33 AM  Result Value Range   Glucose-Capillary 54 (*) 70 - 99 mg/dL   Comment 1 Notify RN    GLUCOSE, CAPILLARY     Status: Abnormal   Collection Time    02/07/13  5:21 AM      Result Value Range   Glucose-Capillary 136 (*) 70 - 99 mg/dL   Comment 1 Notify RN    GLUCOSE, CAPILLARY     Status: Abnormal   Collection Time    02/07/13  7:19 AM      Result Value Range   Glucose-Capillary 127 (*) 70 - 99 mg/dL    Studies/Results: @RISRSLT24 @    Assessment: Melena.  Anemia.  Plan: PPI to be started. Check on MRA.     [name] F 02/07/2013, 10:07 AM

## 2013-02-07 NOTE — Progress Notes (Signed)
Cbg at 0430 54, 1/2 amp d50 adm. cbg at 0515 136

## 2013-02-07 NOTE — Progress Notes (Addendum)
[facility] PROGRESS NOTE  Kendal Ghazarian WUJ:[id number] DOB: [birth year] DOA: 01/09/2013 PCP: Ginette Otto, MD  Assessment/Plan:  1. GI bleed with acute blood loss anemia  -Transfused 2 units of packed red blood cells 3/31 - no further melena noted per RN - start IV PPI - [facility] following, FU on MRA today to rule out mesenteric ischemia.  - Keep NPO, IVF - Monitor CBC Q8 - ASA on hold .  2. Enteritis Vs ischemia -exam with significant abdominal tenderness, continue empiric Zosyn.  -repeat WBC pending - lactic acid 1.1 reassuring  3. Weight loss  -Patient appears to have weight loss from changes to her appetite secondary to subacute illness.  -Check TSH 6.0, check Free T4, continue synthroid   4. Hypothyroidism  -Continue current dose of Synthroid  - if Free T4 is low will need synthroid dose increased .  5. Diabetes  -stable, Continue home dose of Lantus, SSI every 4 hours while n.p.o.  -Hemoglobin A1c done 01/20/2013:5.9%   6. Generalized weakness with fall  -Likely multifactorial. Formal PT/OT evaluations when more stable.   7. Alzheimer's dementia  -Continue Aricept.   8. Hypertension  -Continue lisinopril.   9. History of CVA (cerebrovascular accident)  -No deficits per her son. Hold aspirin.  -continue zetia  Code Status: DNR  Family Communication: none at bedside today, Son Eula Fried ([phone])  Disposition Plan: Home when stable.  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, I requested a Vascular Surgery eval per Dr.Brabham  Consultants:  [facility]  Antibiotics:  ZOsyn 3/31  HPI/Subjective:  C/o abdominal pain, no vomiting, diarrhea, no further melena overnight noted per RN  Objective: Filed Vitals:   02/02/2013 1415 01/26/2013 1700 01/12/2013 2211 02/07/13 0529  BP: 160/76 166/67 156/65 134/54  Pulse: 90 84 82 82  Temp: 97.9 F (36.6 C)  98.1 F (36.7 C) 98.1 F (36.7 C)  TempSrc:   Oral Oral  Resp: 18  18 18    SpO2:   100% 97%    Intake/Output Summary (Last 24 hours) at 02/07/13 1245 Last data filed at 02/07/13 0530  Gross per 24 hour  Intake 1296.66 ml  Output   1400 ml  Net -103.34 ml   There were no vitals filed for this visit.  Exam:   General:  Alert, awake, oriented to self only, answers few questions  Cardiovascular: S1S2/RRR  Respiratory: diminished at bases  Abdomen: soft, tender in lower quadrants and periumbilical  Ext: no edema c/c  Neuro: moves all extremities, no localising signs  Data Reviewed: Basic Metabolic Panel:  Recent Labs Lab 02/04/2013 0305 02/07/13 0255  NA 137 140  K 3.6 3.0*  CL 102 108  CO2 27 26  GLUCOSE 99 75  BUN 41* 31*  CREATININE 0.79 0.82  CALCIUM 9.1 7.9*   Liver Function Tests:  Recent Labs Lab 01/07/2013 0305  AST 29  ALT 20  ALKPHOS 107  BILITOT 0.3  PROT 5.5*  ALBUMIN 2.6*   No results found for this basename: LIPASE, AMYLASE,  in the last 168 hours No results found for this basename: AMMONIA,  in the last 168 hours CBC:  Recent Labs Lab 01/17/2013 0305 01/24/2013 1833 02/07/13 0255 02/07/13 1110  WBC 37.9*  --  30.9*  --   NEUTROABS 32.6*  --   --   --   HGB 6.5* 8.7* 8.2* 8.1*  HCT 19.5*  --  23.8*  --   MCV 92.4  --  84.5  --  PLT 459*  --  343  --    Cardiac Enzymes: No results found for this basename: CKTOTAL, CKMB, CKMBINDEX, TROPONINI,  in the last 168 hours BNP (last 3 results) No results found for this basename: PROBNP,  in the last 8760 hours CBG:  Recent Labs Lab 02/07/13 0104 02/07/13 0433 02/07/13 0521 02/07/13 0719 02/07/13 1139  GLUCAP 121* 54* 136* 127* 122*    No results found for this or any previous visit (from the past 240 hour(s)).   Studies: Ct Abdomen Pelvis W Contrast  02/11/2013  *RADIOLOGY REPORT*  Clinical Data: Right hip and lower abdominal pain.  Anemia.  The patient fell 1 month ago.  White cell count 37.9.  CT ABDOMEN AND PELVIS WITH CONTRAST  Technique:   Multidetector CT imaging of the abdomen and pelvis was performed following the standard protocol during bolus administration of intravenous contrast.  Contrast: OMNIPAQUE IOHEXOL 300 MG/ML  SOLN  Comparison: None.  Findings: Atelectasis in the lung bases.  Minimal left pleural effusion.  Coronary artery calcifications.  The liver, spleen, gallbladder, pancreas, adrenal glands, kidneys, inferior vena cava, and retroperitoneal lymph nodes are unremarkable except for vascular calcifications.  Extensive calcification of the abdominal aorta.  Tortuous aorta.  Focal aortic calcifications at the abdominal inlet may contribute to focal stenosis.  Small bowel are mildly distended and predominately fluid-filled with suggestion of diffuse wall thickening involving the small bowel, terminal ileum, and ascending colon.  Changes are most consistent with gastroenteritis or other inflammatory process. No pneumatosis or portal venous gas.   There are calcifications at the origin of the superior mesenteric artery and focal stenosis is not excluded. The superior mesenteric artery and vein appear patent. Correlate for clinical or laboratory evidence of early bowel ischemia.  Pelvis:  The bladder is decompressed with Foley catheter.  Uterus is normal size.  No abnormal adnexal masses.  No free or loculated pelvic fluid collections.  No significant pelvic lymphadenopathy. The appendix is not visualized.  No evidence of diverticulitis. Degenerative changes in the lumbar spine.  No displaced fractures identified.  IMPRESSION: Fluid filled nondistended small bowel with diffuse wall thickening of the small bowel.  This is likely to represent gastroenteritis or inflammatory disease.  However, there is calcification in the origin of the superior mesenteric artery and mesenteric ischemia should be excluded.  No specific signs of pneumatosis or mesenteric edema.   Original Report Authenticated By: Burman Nieves, M.D.    Mr [facility] Abdomen  W Wo Contrast  02/07/2013  *RADIOLOGY REPORT*  Clinical Data:  Abdominal pain.  MRA ABDOMEN WITH AND WITHOUT CONTRAST  Technique:  Angiographic images of the abdomen were obtained using MRA technique with intravenous contrast.  Contrast:  13mL MULTIHANCE GADOBENATE DIMEGLUMINE 529 MG/ML IV SOLN  Comparison:   None.  Findings:  There is atherosclerotic disease of the abdominal aorta without evidence of aneurysm.  The celiac axis appears chronically occluded.  There is significant stenosis involving the proximal superior mesenteric artery with stenosis likely approaching 70 - 90% in estimated narrowing by MRA.  The main trunk is opacified and the superior mesenteric artery is not completely occluded.  The inferior mesenteric artery is open.  A single right renal artery and two separate left renal arteries are demonstrated and show normal patency.  There is no significant nonvascular findings by MRA.  IMPRESSION:  1.  Chronic occlusion of the celiac axis. 2.  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  The inferior  mesenteric artery is open.   Original Report Authenticated By: Irish Lack, M.D.    Dg Abd Acute W/chest  01/13/2013  *RADIOLOGY REPORT*  Clinical Data: Abdominal pain and distension.  ACUTE ABDOMEN SERIES (ABDOMEN 2 VIEW & CHEST 1 VIEW)  Comparison: Chest 05/29/2009  Findings: The patient is rotated.  Shallow inspiration with lucency in the lungs suggesting emphysema.  Normal heart size and pulmonary vascularity.  Calcified granulomas in the lungs.  No focal consolidation or airspace disease.  No blunting of costophrenic angles.  No pneumothorax.  Degenerative changes in the spine and shoulders.  Resection or resorption of the distal right clavicle.  There are multiple gas-filled loops of small bowel in the mid abdomen without distension.  There is suggestion of wall thickening and fold thickening.  Changes are most consistent with inflammatory bowel  disease versus gastroenteritis.  Gas and stool throughout the colon without distension.  No free intra-abdominal air.  No abnormal air fluid levels.  Degenerative changes in the spine.  No radiopaque stones.  Vascular calcifications.  IMPRESSION: No evidence of active pulmonary disease.  Gas filled nondistended small bowel with wall thickening suggesting infiltrative process such as gastroenteritis, edema, or inflammatory bowel disease.   Original Report Authenticated By: Burman Nieves, M.D.     Scheduled Meds: . donepezil  10 mg Oral QHS  . ezetimibe  10 mg Oral Daily  . insulin aspart  0-15 Units Subcutaneous Q4H  . insulin glargine  8 Units Subcutaneous QHS  . levothyroxine  100 mcg Oral QAC breakfast  . lisinopril  20 mg Oral Daily  . pantoprazole (PROTONIX) IV  40 mg Intravenous Q12H  . piperacillin-tazobactam (ZOSYN)  IV  3.375 g Intravenous Q8H   Continuous Infusions: . sodium chloride 100 mL/hr at 01/26/2013 1732    Principal Problem:   GI bleed with acute blood loss anemia Active Problems:   Weight loss   Hypothyroidism   Diabetes   Cold intolerance   Generalized weakness with fall   Alzheimer's dementia   Hypertension   Hyperlipidemia   History of CVA (cerebrovascular accident)   Diarrhea   Leukocytosis   Enteritis    Time spent:    [facility]  [facility] Pager [phone]. If 7PM-7AM, please contact night-coverage at [email or web address], password [facility] 02/07/2013, 12:45 PM  LOS: 1 day

## 2013-02-07 NOTE — Consult Note (Addendum)
[facility] [place]      Consult Note  Patient name: [name] MRN: [id number] DOB: [birth year] Sex: female  Consulting Physician:  Hospitalists  Reason for Consult:  Chief Complaint  Patient presents with  . Hip Pain  . Abdominal Pain    HISTORY OF PRESENT ILLNESS: This is an 79 year old female who I have been asked to evaluate for possible mesenteric ischemia. The patient suffers from severe Alzheimer's dementia and cannot provide any history tonight. The history is obtained from the chart. She apparently has been having black stools for the past 2 weeks which were thought to be secondary to iron supplementation. However, over the past 24-48 hours she has had severe right lower quadrant abdominal pain. She does suffer from anorexia over the past 2 days. She does have a history of recent weight loss.  Past Medical History  Diagnosis Date  . Hypothyroidism   . Diabetes mellitus without complication   . Alzheimer disease   . Hypertension   . Hyperlipidemia   . CVA (cerebral infarction) 09/2007  . DJD (degenerative joint disease)   . Bursitis of hip     Left  . Osteopenia   . Constipation     Past Surgical History  Procedure Laterality Date  . Rotator cuff surgery      Right  . Right cateract surgery      History   Social History  . Marital Status: Widowed    Spouse Name: N/A    Number of Children: 2  . Years of Education: N/A   Occupational History  . Housewife, bank teller    Social History Main Topics  . Smoking status: Former Smoker -- 10 years    Types: Cigarettes  . Smokeless tobacco: Never Used  . Alcohol Use: No     Comment: Not since her stroke.  . Drug Use: No  . Sexually Active: Not Currently    Birth Control/ Protection: None   Other Topics Concern  . Not on file   Social History Narrative   Widowed.  Lives with son.  Ambulates with a cane at baseline.    Family History  Problem Relation Age of Onset  . Cancer  Mother     ? Pancreatic  . Cancer Brother   . Heart failure Father   . Heart failure Brother   . Cancer Sister   . Alcoholism Son     Allergies as of 02/01/2013  . (No Known Allergies)    No current facility-administered medications on file prior to encounter.   No current outpatient prescriptions on file prior to encounter.     REVIEW OF SYSTEMS: Unable to obtain from the patient secondary to her dementia  PHYSICAL EXAMINATION: General: The patient appears their stated age.  Vital signs are BP 134/54  Pulse 82  Temp(Src) 98.1 F (36.7 C) (Oral)  Resp 18  SpO2 97% Pulmonary: Respirations are non-labored HEENT:  No gross abnormalities Abdomen: Exquisitely tender but soft Musculoskeletal: There are no major deformities.   Neurologic: Nonverbal and confused Skin: There are no ulcer or rashes noted. Psychiatric: The patient has normal affect. Cardiovascular: There is a regular rate and rhythm without significant murmur appreciated.  Diagnostic Studies: I have reviewed her CT angiogram which shows diffuse small bowel thickening. Her MRA reveals a high-grade superior mesenteric artery stenosis    Assessment:  Abdominal pain, possible mesenteric asking Plan: This is a very challenging situation given the patient's age and underlying dementia. By  MRA, she has a high-grade superior mesenteric artery stenosis which could certainly explain her symptoms, and that this could be mesenteric ischemia with poorly perfused bowel, causing her melana. At this point, based on her physical exam and the fact that she has a white count of 30,000, I am very concerned that she may have dead bowel. This poses a problem as far as how to manage this situation. Before proceeding with mesenteric revascularization, I would want to confirm that her bowel is still viable. This would involve an abdominal operation. If her bowel is viable, I would consider superior mesenteric artery stenting. Given that  the patient is severely demented and a DO NOT RESUSCITATE, this is likely a heroic measure which will likely have a very poor outcome. The family needs to be made aware of her situation and way in on our options, palliative care is probably the best route at this time. I am going to add on a lactate to see if this at any more diagnostic information. I spoke with the hospitalist on call and relayed my thoughts. I also tried to contact the son but could not get in touch with him.   I finally spoke with the son for about 30 minutes.  We discussed all options. We both agree that we should proceed with palliative care measures.  I will have the primary team formalize this with the son in the morning  V. Charlena Cross, M.D. [facility] [place] Office: [phone] Pager:  [phone]

## 2013-02-07 NOTE — Progress Notes (Signed)
Inpatient Diabetes Program Recommendations  AACE/ADA: New Consensus Statement on Inpatient Glycemic Control (2013)  Target Ranges:  Prepandial:   less than 140 mg/dL      Peak postprandial:   less than 180 mg/dL (1-2 hours)      Critically ill patients:  140 - 180 mg/dL   Reason for Visit: Hypoglycemia  Results for KHAMIL, LAMICA (MRN [id number]) as of 02/07/2013 13:20  Ref. Range 02/07/2013 01:04 02/07/2013 04:33 02/07/2013 05:21 02/07/2013 07:19 02/07/2013 11:39  Glucose-Capillary Latest Range: 70-99 mg/dL 782 (H) 54 (L) 956 (H) 127 (H) 122 (H)   Hypoglycemia - CBG of 54 this am.  Inpatient Diabetes Program Recommendations Insulin - Basal: Decrease Lantus to 5 units QHS   Note: Will follow.  Thank you. Ailene Ards, RD, LDN, CDE Inpatient Diabetes Coordinator [phone]

## 2013-02-07 NOTE — Progress Notes (Signed)
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 I would recommend a vascular surgery consult to see if anything needs to be done about this, provided that her family is agreeable.

## 2013-02-07 DEATH — deceased

## 2013-02-08 DIAGNOSIS — Z515 Encounter for palliative care: Secondary | ICD-10-CM

## 2013-02-08 DIAGNOSIS — K55059 Acute (reversible) ischemia of intestine, part and extent unspecified: Principal | ICD-10-CM

## 2013-02-08 LAB — CBC
HCT: 24.4 % — ABNORMAL LOW (ref 36.0–46.0)
Hemoglobin: 8 g/dL — ABNORMAL LOW (ref 12.0–15.0)
Hemoglobin: 8.1 g/dL — ABNORMAL LOW (ref 12.0–15.0)
RBC: 2.8 MIL/uL — ABNORMAL LOW (ref 3.87–5.11)
WBC: 22.7 10*3/uL — ABNORMAL HIGH (ref 4.0–10.5)
WBC: 17 10*3/uL — ABNORMAL HIGH (ref 4.0–10.5)

## 2013-02-08 LAB — GLUCOSE, CAPILLARY
Glucose-Capillary: 87 mg/dL (ref 70–99)
Glucose-Capillary: 77 mg/dL (ref 70–99)
Glucose-Capillary: 113 mg/dL — ABNORMAL HIGH (ref 70–99)
Glucose-Capillary: 194 mg/dL — ABNORMAL HIGH (ref 70–99)
Glucose-Capillary: 205 mg/dL — ABNORMAL HIGH (ref 70–99)

## 2013-02-08 LAB — BASIC METABOLIC PANEL
BUN: 24 mg/dL — ABNORMAL HIGH (ref 6–23)
Chloride: 114 mEq/L — ABNORMAL HIGH (ref 96–112)
Glucose, Bld: 83 mg/dL (ref 70–99)
Potassium: 3.4 mEq/L — ABNORMAL LOW (ref 3.5–5.1)

## 2013-02-08 MED ORDER — DEXTROSE-NACL 5-0.45 % IV SOLN
INTRAVENOUS | Status: DC
  Administered 2013-02-08: 05:00:00 via INTRAVENOUS
  Administered 2013-02-09: 10 mL/h via INTRAVENOUS

## 2013-02-08 NOTE — Progress Notes (Signed)
Patient AV:[name]      DOB: [birth year]      JXB:[id number]  Summary of GOC; Full note to follow   Met with patient's son Eula Fried understands the grave condition of his mother's bowel.  He was some what shocked at how good she looked related to the conversation that he had with Dr. Myra Gianotti.  He understands the risks of intervention and that the intervention is not likely to correct what seemingly is seriously damaged bowel.  [name] would like to shift to full comfort care and seek residential .  This would include discontinue antibiotics, ivf, and cbg (son may want to check if she is symptomatic). Plan to transfer to PCU and look at residential hospice placement with SW in am.   Total time:  550 pm -700 pm  Discussed with Dr. Mahala Menghini.  Ernan Runkles L. Ladona Ridgel, MD MBA The Palliative Medicine Team at [facility] Phone: [phone] Pager: [phone]

## 2013-02-08 NOTE — Consult Note (Addendum)
[name]      DOB: 1924-08-15      AOZ:[id number]     Consult Note from the Palliative Medicine Team at [facility]    Consult Requested by: Dr.Samtani    PCP: Rhonda Otto, MD Reason for Consultation: Goals of care related symptom recommendations     Phone [phone]  Assessment of patients Current state: Patient is an 79 year old white female with a known past medical history significant for dementia, diabetes, hypothyroidism, history of CVA, and hypertension. She presented with abdominal pain inflammatory stools which have been present for approximately 2 weeks. Family felt that this might be related to iron supplementation but when the pain started over the past 24-48 hours he felt that she needed to come to the hospital. Check 2 falls over the past month. Patient was seen and examined in the hospital and found to have what appears to mesenteric ischemia. Vascular surgery was consulted and did not feel that intervention would salvage this palate she has an elevated white count . The patient's son [name] has decided to  pursue full comfort in the face of understanding that even with intervention the patient likely has significant bowel damage and would not survive. At this time the patient is relatively comfortable she is requiring medication every couple of hours and especially if she is moved she is not eating she is pleasantly confused and unable to participate in conversation regarding her goals for treatment.    Goals of Care: 1.  Code Status: DO NOT RESUSCITATE DO NOT INTUBATE    2. Scope of Treatment: At this time the patient's son would like to transition to the palliative care unit with discontinuation of the IV support and antibiotics. He desires comfort feeding and would not want feeding tubes to be placed. He is open to consideration for residential hospice    4. Disposition: Offer choice for residential hospice    3. Symptom  Management:   1. Anxiety/Agitation: Ativan 0.5 mg every 4 hours when necessary for agitation  2. Pain: Roxanol 5 mg every 2 hours sublingual when necessary for shortness of breath her pain can be utilized versus 2 mg IV every 2 hours when necessary for severe pain. Continue Protonix  3. Fever: Tylenol when necessary  4. Nausea/Vomiting: Zofran as needed  5. Terminal Secretions: Atropine can be added at this time terminal secretions or not initiate  4. Psychosocial: patient has had an advanced dementia for some time but has been functioning at home with her son just has been approximately a year and half ago  5. Spiritual:Patient is supported by her family [employer].        Patient Documents Completed or Given: Document Given Completed  Advanced Directives Pkt    MOST    DNR    Gone from My Sight    Hard Choices      Brief HPI: Patient is an 79 year old female admitted with Dr. his stools diarrhea for at least 2 weeks and no abdominal pain with some nausea. Her asked to address goals of care in the face of a new diagnosis of mesenteric ischemia    ROS: Unable to assess secondary to cognitive capacity decreased    PMH:  Past Medical History  Diagnosis Date  . Hypothyroidism   . Diabetes mellitus without complication   . Alzheimer disease   . Hypertension   . Hyperlipidemia   . CVA (cerebral infarction) 09/2007  . DJD (degenerative joint disease)   .  Bursitis of hip     Left  . Osteopenia   . Constipation      PSH: Past Surgical History  Procedure Laterality Date  . Rotator cuff surgery      Right  . Right cateract surgery     I have reviewed the FH and SH and  If appropriate update it with new information. No Known Allergies Scheduled Meds: . donepezil  10 mg Oral QHS  . ezetimibe  10 mg Oral Daily  . insulin aspart  0-15 Units Subcutaneous Q4H  . insulin glargine  8 Units Subcutaneous QHS  . levothyroxine  100 mcg Oral QAC breakfast  .  lisinopril  20 mg Oral Daily  . pantoprazole (PROTONIX) IV  40 mg Intravenous Q12H  . piperacillin-tazobactam (ZOSYN)  IV  3.375 g Intravenous Q8H   Continuous Infusions: . dextrose 5 % and 0.45% NaCl 100 mL/hr at 02/08/13 0430   PRN Meds:.acetaminophen, acetaminophen, morphine injection, ondansetron (ZOFRAN) IV, ondansetron, oxyCODONE    BP 158/79  Pulse 75  Temp(Src) 98.5 F (36.9 C) (Oral)  Resp 18  SpO2 99%   PPS: 20%   Intake/Output Summary (Last 24 hours) at 02/08/13 1851 Last data filed at 02/07/13 2212  Gross per 24 hour  Intake      0 ml  Output    300 ml  Net   -300 ml   LBM:       02/07/2013               Physical Exam:  General: Well-developed moderately nourished white female no acute distress  HEENT:  Pupils are equal round and reactive to light extraocular muscles are intact his membranes are moist neck is supple there's no JVD no lymphocytic respiratory  Chest:   Decreased but clear to auscultation his daughters or wheezes  CVS: Regular rate and rhythm positive S1 and S2 no S3-S4 no murmurs or gallops  Abdomen: Soft mildly tender in the lower cartridge without guarding rebound  Ext: Warm without mottling for pain, no edema  Neuro:Pleasantly confused she will awaken but does not identify oriented questions appropriately   Labs: CBC    Component Value Date/Time   WBC 17.0* 02/08/2013 1314   RBC 2.80* 02/08/2013 1314   HGB 8.1* 02/08/2013 1314   HCT 24.7* 02/08/2013 1314   PLT 330 02/08/2013 1314   MCV 88.2 02/08/2013 1314   MCH 28.9 02/08/2013 1314   MCHC 32.8 02/08/2013 1314   RDW 19.1* 02/08/2013 1314   LYMPHSABS 1.1 Feb 10, 2013 0305   MONOABS 4.2* 02/15/2013 0305   EOSABS 0.0 02/20/2013 0305   BASOSABS 0.0 10-Feb-2013 0305       CMP     Component Value Date/Time   NA 141 02/08/2013 0506   K 3.4* 02/08/2013 0506   CL 114* 02/08/2013 0506   CO2 24 02/08/2013 0506   GLUCOSE 83 02/08/2013 0506   BUN 24* 02/08/2013 0506   CREATININE 0.75 02/08/2013 0506   CALCIUM 7.7*  02/08/2013 0506   PROT 5.5* 10-Feb-2013 0305   ALBUMIN 2.6* Feb 10, 2013 0305   AST 29 02/21/2013 0305   ALT 20 02/21/2013 0305   ALKPHOS 107 03/07/2013 0305   BILITOT 0.3 02/24/2013 0305   GFRNONAA 74* 02/08/2013 0506   GFRAA 86* 02/08/2013 0506    Chest Xray Reviewed/Impressions: No active pulmonary process, gas-filled distended small bowel with wall thickening  CT scan of the abdomen and pelvis  Reviewed/Impressions: Extensive calcification of the aorta, the small bowel is distended  fluid filled diffuse wall thickening areas involvement of the small bowel, terminal ileum, descending colon. Superior mesenteric area is extremely calcified and may be contributing to mesenteric ischemia    Time In Time Out Total Time Spent with Patient Total Overall Time  5:50 PM   7 PM   70 minutes   70 minutes     Greater than 50%  of this time was spent counseling and coordinating care related to the above assessment and plan.    Detrell Umscheid L. Ladona Ridgel, MD MBA The Palliative Medicine Team at [facility] Phone: [phone] Pager: [phone]

## 2013-02-08 NOTE — Progress Notes (Signed)
[facility] PROGRESS NOTE  [name] ZOX:[id number] DOB: [birth year] DOA: 01/18/2013 PCP: Ginette Otto, MD  Assessment/Plan:  1. GI bleed with acute blood loss anemia  -Transfused 2 units of packed red blood cells 3/31 - no further melena noted  - start IV PPI - Keep NPO, IVF - Monitor CBC Q8 - ASA on hold .  2. Enteritis Vs ischemia -exam with significant abdominal tenderness, continue empiric Zosyn-would de-escalalte in next 1-2 days dependant on Palliative discussions  -MRA abdomen shows significant SMA stenosis-Appreciate Vascula rinput on 4.1 from Dr. Wardell Honour intervention planned -repeat WBC 17,000 - lactic acid 1.1 reassuring  3. Weight loss  -Patient appears to have weight loss from changes to her appetite secondary to subacute illness.  -Check TSH 6.0, continue synthroid   4. Hypothyroidism  -Continue current dose of Synthroid  -defer increasing dose synthroid-unlikely to make a global change to he rstatus .  5. Diabetes  -stable, Continue home dose of Lantus, SSI every 4 hours while n.p.o.  -Hemoglobin A1c done 01/20/2013: 5.9%   6. Generalized weakness with fall  -Likely multifactorial. Formal PT/OT evaluations when more stable.   7. Alzheimer's dementia  -Continue Aricept.   8. Hypertension  -Continue lisinopril.   9. History of CVA (cerebrovascular accident)  -No deficits per her son. Hold aspirin.  -continue zetia  Code Status: DNR  Family Communication: none at bedside today, Son Eula Fried ([phone])-[id number] consult pending for later today 4.2.14 Disposition Plan: Home when stable.  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, I requested a Vascular Surgery eval per Dr.Brabham  Consultants:  [facility]  Antibiotics:  ZOsyn 3/31  HPI/Subjective:  Awakens and state sshe isn;t in pain, but notably demented.  Cries out when I press on abdomen No reported N/v/dark stools  Objective: Filed Vitals:   02/07/13 0529 02/07/13 2212 02/08/13 0654 02/08/13 1334  BP: 134/54 146/64 164/82 158/79  Pulse: 82 78 78 75  Temp: 98.1 F (36.7 C) 98.6 F (37 C) 98.3 F (36.8 C) 98.5 F (36.9 C)  TempSrc: Oral Oral Oral Oral  Resp: 18 18 16 18   SpO2: 97% 97% 98% 99%    Intake/Output Summary (Last 24 hours) at 02/08/13 1543 Last data filed at 02/07/13 2212  Gross per 24 hour  Intake   1400 ml  Output    300 ml  Net   1100 ml   There were no vitals filed for this visit.  Exam:   General:  Alert, awake, oriented to self only, answers few questions  Cardiovascular: S1S2/RRR  Respiratory: diminished at bases  Abdomen: soft, tender in lower quadrants and periumbilical  Ext: no edema c/c  Neuro: moves all extremities, no localising signs  Data Reviewed: Basic Metabolic Panel:  Recent Labs Lab 01/26/2013 0305 02/07/13 0255 02/08/13 0506  NA 137 140 141  K 3.6 3.0* 3.4*  CL 102 108 114*  CO2 27 26 24   GLUCOSE 99 75 83  BUN 41* 31* 24*  CREATININE 0.79 0.82 0.75  CALCIUM 9.1 7.9* 7.7*   Liver Function Tests:  Recent Labs Lab 02/01/2013 0305  AST 29  ALT 20  ALKPHOS 107  BILITOT 0.3  PROT 5.5*  ALBUMIN 2.6*   No results found for this basename: LIPASE, AMYLASE,  in the last 168 hours No results found for this basename: AMMONIA,  in the last 168 hours CBC:  Recent Labs Lab 01/07/2013 0305  02/07/13 0255 02/07/13 1110 02/07/13 1344 02/07/13 2038 02/08/13 0506 02/08/13 1314  WBC 37.9*  --  30.9*  --  31.2* 30.2* 22.7* 17.0*  NEUTROABS 32.6*  --   --   --   --   --   --   --   HGB 6.5*  < > 8.2* 8.1* 8.4* 8.6* 8.0* 8.1*  HCT 19.5*  --  23.8*  --  24.9* 25.7* 24.4* 24.7*  MCV 92.4  --  84.5  --  86.5 87.1 87.8 88.2  PLT 459*  --  343  --  347 329 304 330  < > = values in this interval not displayed. Cardiac Enzymes: No results found for this basename: CKTOTAL, CKMB, CKMBINDEX, TROPONINI,  in the last 168 hours BNP (last 3 results) No results found for this  basename: PROBNP,  in the last 8760 hours CBG:  Recent Labs Lab 02/07/13 2058 02/08/13 0045 02/08/13 0409 02/08/13 0711 02/08/13 1129  GLUCAP 127* 87 77 113* 108*    Recent Results (from the past 240 hour(s))  CLOSTRIDIUM DIFFICILE BY PCR     Status: None   Collection Time    02/07/13  1:20 PM      Result Value Range Status   C difficile by pcr NEGATIVE  NEGATIVE Final  STOOL CULTURE     Status: None   Collection Time    02/07/13  1:20 PM      Result Value Range Status   Specimen Description STOOL   Final   Special Requests Normal   Final   Culture Culture reincubated for better growth   Final   Report Status PENDING   Incomplete     Studies: Mr Maxine Glenn Abdomen W Wo Contrast  02/07/2013  *RADIOLOGY REPORT*  Clinical Data:  Abdominal pain.  MRA ABDOMEN WITH AND WITHOUT CONTRAST  Technique:  Angiographic images of the abdomen were obtained using MRA technique with intravenous contrast.  Contrast:  13mL MULTIHANCE GADOBENATE DIMEGLUMINE 529 MG/ML IV SOLN  Comparison:   None.  Findings:  There is atherosclerotic disease of the abdominal aorta without evidence of aneurysm.  The celiac axis appears chronically occluded.  There is significant stenosis involving the proximal superior mesenteric artery with stenosis likely approaching 70 - 90% in estimated narrowing by MRA.  The main trunk is opacified and the superior mesenteric artery is not completely occluded.  The inferior mesenteric artery is open.  A single right renal artery and two separate left renal arteries are demonstrated and show normal patency.  There is no significant nonvascular findings by MRA.  IMPRESSION:  1.  Chronic occlusion of the celiac axis. 2.  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  The inferior mesenteric artery is open.   Original Report Authenticated By: Irish Lack, M.D.     Scheduled Meds: . donepezil  10 mg Oral QHS  . ezetimibe  10 mg Oral Daily  . insulin  aspart  0-15 Units Subcutaneous Q4H  . insulin glargine  8 Units Subcutaneous QHS  . levothyroxine  100 mcg Oral QAC breakfast  . lisinopril  20 mg Oral Daily  . pantoprazole (PROTONIX) IV  40 mg Intravenous Q12H  . piperacillin-tazobactam (ZOSYN)  IV  3.375 g Intravenous Q8H   Continuous Infusions: . dextrose 5 % and 0.45% NaCl 100 mL/hr at 02/08/13 0430    Principal Problem:   GI bleed with acute blood loss anemia Active Problems:   Weight loss   Hypothyroidism   Diabetes   Cold intolerance   Generalized weakness with fall  Alzheimer's dementia   Hypertension   Hyperlipidemia   History of CVA (cerebrovascular accident)   Diarrhea   Leukocytosis   Enteritis   Acute mesenteric ischemia    Time spent:    Rhetta Mura  [facility] Pager [phone]. If 7PM-7AM, please contact night-coverage at [email or web address], password [facility] 02/08/2013, 3:43 PM  LOS: 2 days

## 2013-02-08 NOTE — Progress Notes (Signed)
Appreciate vascular surgery input. Palliative care consult placed and are here to see her now. From a GI  Standpoint I have nothing more to suggest. We will sign off. Call us again if needed.

## 2013-02-08 NOTE — Progress Notes (Signed)
INITIAL NUTRITION ASSESSMENT  DOCUMENTATION CODES Per approved criteria  -Not Applicable   INTERVENTION: - Nutrition per goals of care - Recommend nursing obtain height/weight - Will continue to monitor   NUTRITION DIAGNOSIS: Inadequate oral intake related to inability to eat as evidenced by NPO.    Goal: Nutrition per goals of care.   Monitor:  Weights, labs, diet advancement, goals of care  Reason for Assessment: Nutrition risk   79 y.o. female  Admitting Dx: GI bleed  ASSESSMENT: Per ED notes, son reported pt was eating nothing but ice chips for 2 days PTA. Pt with Alzheimer's dementia. Attempted to call son to get further information, however there was no answer. Pt NPO since admission. Pt asleep during visit. Pt with history of black tarry stools x 2 weeks. GI following. MD reports history of recent weight loss, however no weight trend available. [facility] [place] following and noted pt with high-grade superior mesenteric artery stenosis and discussed options with son who agreed to palliative care consult which will be completed today.   Height: Ht Readings from Last 1 Encounters:  No data found for Ht    Weight: Wt Readings from Last 1 Encounters:  No data found for Wt    Ideal Body Weight: Unable to assess   Wt Readings from Last 10 Encounters:  No data found for Wt    Usual Body Weight: Unable to assess   BMI:  There is no height or weight on file to calculate BMI.  Estimated Nutritional Needs: Unable to assess r/t lack of ht/wt.   Skin: Intact   Diet Order: NPO  EDUCATION NEEDS: -No education needs identified at this time   Intake/Output Summary (Last 24 hours) at 02/08/13 1151 Last data filed at 02/07/13 2212  Gross per 24 hour  Intake   1400 ml  Output    400 ml  Net   1000 ml    Last BM: 4/1  Labs:   Recent Labs Lab 02/04/2013 0305 02/07/13 0255 02/08/13 0506  NA 137 140 141  K 3.6 3.0* 3.4*  CL 102  108 114*  CO2 27 26 24   BUN 41* 31* 24*  CREATININE 0.79 0.82 0.75  CALCIUM 9.1 7.9* 7.7*  GLUCOSE 99 75 83    CBG (last 3)   Recent Labs  02/08/13 0409 02/08/13 0711 02/08/13 1129  GLUCAP 77 113* 108*    Scheduled Meds: . donepezil  10 mg Oral QHS  . ezetimibe  10 mg Oral Daily  . insulin aspart  0-15 Units Subcutaneous Q4H  . insulin glargine  8 Units Subcutaneous QHS  . levothyroxine  100 mcg Oral QAC breakfast  . lisinopril  20 mg Oral Daily  . pantoprazole (PROTONIX) IV  40 mg Intravenous Q12H  . piperacillin-tazobactam (ZOSYN)  IV  3.375 g Intravenous Q8H    Continuous Infusions: . dextrose 5 % and 0.45% NaCl 100 mL/hr at 02/08/13 0430    Past Medical History  Diagnosis Date  . Hypothyroidism   . Diabetes mellitus without complication   . Alzheimer disease   . Hypertension   . Hyperlipidemia   . CVA (cerebral infarction) 09/2007  . DJD (degenerative joint disease)   . Bursitis of hip     Left  . Osteopenia   . Constipation     Past Surgical History  Procedure Laterality Date  . Rotator cuff surgery      Right  . Right cateract surgery  Malonie Tatum Baron MS, RD, LDN [phone] Pager [phone] After Hours Pager  

## 2013-02-08 NOTE — Progress Notes (Addendum)
Thank you for consulting the Palliative Medicine Team at [facility] to meet your patient's and family's needs.   The reason that you asked [place] to see your patient is  For  GOC and symptom management  We have scheduled your patient for a meeting:  Voice message left for Guadelupe Sabin to call to schedule time  The Surrogate decision make is: Son Asa Saunas information: [phone]  Other family members that need to be present: Will be up to [name]    Your patient is able/unable to participate: Unable  Additional Narrative:  Visited with patient this am.  Very hard of hearing , can hear with direct vocalization into left ear.  She denies pain at this time . Exam completed.  Will schedule meeting to talk about GOC with son asap.  Interim Symptom Rec:  Since patient NPO would consider dc cbg, insulin and in particular Lantus..  Pain medication prn appropriate. Patient currently comfortable.    Keontae Levingston L. Ladona Ridgel, MD MBA The Palliative Medicine Team at [facility] Phone: [phone] Pager: [phone]    Addendum:  Son called back and is available between 530-600 pm  Hudsen Fei L. Ladona Ridgel, MD MBA The Palliative Medicine Team at [facility] Phone: [phone] Pager: [phone]

## 2013-02-09 MED ORDER — LORAZEPAM 2 MG/ML IJ SOLN
0.5000 mg | INTRAMUSCULAR | Status: DC | PRN
  Administered 2013-02-09 – 2013-02-10 (×2): 0.5 mg via INTRAVENOUS
  Filled 2013-02-09 (×2): qty 1

## 2013-02-09 MED ORDER — LISINOPRIL 20 MG PO TABS
20.0000 mg | ORAL_TABLET | Freq: Every day | ORAL | Status: DC
  Filled 2013-02-09: qty 1

## 2013-02-09 MED ORDER — MORPHINE SULFATE (CONCENTRATE) 10 MG /0.5 ML PO SOLN: 5.0000 mg | ORAL | Status: DC | PRN

## 2013-02-09 NOTE — Progress Notes (Signed)
[facility] PROGRESS NOTE  [name] ZOX:[id number] DOB: Jan 01, 1924 DOA: 02/09/2013 PCP: Ginette Otto, MD  Assessment/Plan:  1. GI bleed with acute blood loss anemia  -Transfused 2 units of packed red blood cells 3/31 - no further melena noted  - start IV PPI - Keep NPO, IVF at saline lock currently - ASA on hold .  2. Enteritis Vs ischemia -exam with significant abdominal tenderness, was on  empiric Zosyn till 4.2.14 -MRA abdomen shows significant SMA stenosis-Appreciate Vascula rinput on 4.1 from Dr. Wardell Honour intervention planned -repeat WBC 17,000 - lactic acid 1.1 reassuring  3. Weight loss  -Patient appears to have weight loss from changes to her appetite secondary to subacute illness.  -Check TSH 6.0, continue synthroid   4. Hypothyroidism  -Continue current dose of Synthroid  -defer increasing dose synthroid-unlikely to make a global change to he rstatus .  5. Diabetes  -Hemoglobin A1c done 01/20/2013: 5.9%  Lantus and CBG checks have been d/c -if unresponsive, might check blood sugars  6. Generalized weakness with fall  -Likely multifactorial. Formal PT/OT evaluations when more stable.   7. Alzheimer's dementia  -discontinue Aricept.   8. Hypertension  -discontinue lisinopril.   9. History of CVA (cerebrovascular accident)  -No deficits per her son. Hold aspirin.  -continue zetia  Code Status: DNR  Family Communication: none at bedside today. Disposition Plan: likely d/c to a free-standing Hospice facility.  Unlikely would be able to comfort feed as she is becoming less and less respnsive  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, I requested a Vascular Surgery eval per Dr.Brabham  Consultants:  Palliative  Vascular surgery  [facility]  Antibiotics:  Zosyn 3/31  HPI/Subjective:  Awakens to touch.  HOH but seems to understand.  Cannot really determine orientation however.  Seems comfortable Nursing reports  agitation and screaming when moved  Objective: Filed Vitals:   02/08/13 1334 02/08/13 2125 02/09/13 0055 02/09/13 0624  BP: 158/79 189/90 188/72 178/82  Pulse: 75 89 88 91  Temp: 98.5 F (36.9 C) 98.8 F (37.1 C) 98.1 F (36.7 C) 98 F (36.7 C)  TempSrc: Oral Oral Oral Oral  Resp: 18 20 18 18   Height:   5\' 3"  (1.6 m)   Weight:   53.1 kg (117 lb 1 oz)   SpO2: 99% 98% 100% 98%    Intake/Output Summary (Last 24 hours) at 02/09/13 1244 Last data filed at 02/09/13 8119  Gross per 24 hour  Intake      0 ml  Output   1700 ml  Net  -1700 ml   Filed Weights   02/09/13 0055  Weight: 53.1 kg (117 lb 1 oz)    Exam:   General:  Alert, awake, oriented to self only, answers few questions  Cardiovascular: S1S2/RRR  Respiratory: diminished at bases  Abdomen: soft, tender in lower quadrants and periumbilical  Data Reviewed: Basic Metabolic Panel:  Recent Labs Lab 02/09/2013 0305 02/07/13 0255 02/08/13 0506  NA 137 140 141  K 3.6 3.0* 3.4*  CL 102 108 114*  CO2 27 26 24   GLUCOSE 99 75 83  BUN 41* 31* 24*  CREATININE 0.79 0.82 0.75  CALCIUM 9.1 7.9* 7.7*   Liver Function Tests:  Recent Labs Lab 2013/02/09 0305  AST 29  ALT 20  ALKPHOS 107  BILITOT 0.3  PROT 5.5*  ALBUMIN 2.6*   No results found for this basename: LIPASE, AMYLASE,  in the last 168 hours No results found for  this basename: AMMONIA,  in the last 168 hours CBC:  Recent Labs Lab 01/09/2013 0305  02/07/13 0255 02/07/13 1110 02/07/13 1344 02/07/13 2038 02/08/13 0506 02/08/13 1314  WBC 37.9*  --  30.9*  --  31.2* 30.2* 22.7* 17.0*  NEUTROABS 32.6*  --   --   --   --   --   --   --   HGB 6.5*  < > 8.2* 8.1* 8.4* 8.6* 8.0* 8.1*  HCT 19.5*  --  23.8*  --  24.9* 25.7* 24.4* 24.7*  MCV 92.4  --  84.5  --  86.5 87.1 87.8 88.2  PLT 459*  --  343  --  347 329 304 330  < > = values in this interval not displayed. Cardiac Enzymes: No results found for this basename: CKTOTAL, CKMB, CKMBINDEX, TROPONINI,   in the last 168 hours BNP (last 3 results) No results found for this basename: PROBNP,  in the last 8760 hours CBG:  Recent Labs Lab 02/08/13 0409 02/08/13 0711 02/08/13 1129 02/08/13 1629 02/08/13 2009  GLUCAP 77 113* 108* 194* 205*    Recent Results (from the past 240 hour(s))  CLOSTRIDIUM DIFFICILE BY PCR     Status: None   Collection Time    02/07/13  1:20 PM      Result Value Range Status   C difficile by pcr NEGATIVE  NEGATIVE Final  STOOL CULTURE     Status: None   Collection Time    02/07/13  1:20 PM      Result Value Range Status   Specimen Description STOOL   Final   Special Requests Normal   Final   Culture NO SUSPICIOUS COLONIES, CONTINUING TO HOLD   Final   Report Status PENDING   Incomplete     Studies: No results found.  Scheduled Meds: . pantoprazole (PROTONIX) IV  40 mg Intravenous Q12H   Continuous Infusions: . dextrose 5 % and 0.45% NaCl 10 mL/hr at 02/08/13 2018    Principal Problem:   GI bleed with acute blood loss anemia Active Problems:   Weight loss   Hypothyroidism   Diabetes   Cold intolerance   Generalized weakness with fall   Alzheimer's dementia   Hypertension   Hyperlipidemia   History of CVA (cerebrovascular accident)   Diarrhea   Leukocytosis   Enteritis   Acute mesenteric ischemia    Time spent:    [name], JAI-GURMUKH  [facility] Pager [phone]. If 7PM-7AM, please contact night-coverage at [email or web address], password [facility] 02/09/2013, 12:44 PM  LOS: 3 days

## 2013-02-09 NOTE — Progress Notes (Signed)
CSW received referral for residential hospice placement.  CSW visited pt room and no family present at this time.  CSW contacted pt son, Marielouise Amey via telephone and left voice message.  CSW to complete full psychosocial assessment when able to speak with pt son re: residential hospice placement.  [name], MSW, LCSWA  Clinical Social Work [phone]

## 2013-02-09 NOTE — Consult Note (Signed)
[facility] Liaison: Received request from CSW Selena Lesser for family interest in [facility]. Chart reviewed. No family present. Will update CSW re availability in am. Thank you. Forrestine Him. LCSW [phone]

## 2013-02-09 NOTE — Progress Notes (Signed)
Clinical Social Work Department BRIEF PSYCHOSOCIAL ASSESSMENT 02/09/2013  Patient:  [name], [name]     Account Number:  [id number]     Admit date:  February 12, 2013  Clinical Social Worker:  Jacelyn Grip  Date/Time:  02/09/2013 11:45 AM  Referred by:  Physician  Date Referred:  02/09/2013 Referred for  Residential hospice placement   Other Referral:   Interview type:  Family Other interview type:    PSYCHOSOCIAL DATA Living Status:  FAMILY Admitted from facility:   Level of care:   Primary support name:  [name] Primary support relationship to patient:  CHILD, ADULT Degree of support available:   adequate    CURRENT CONCERNS Current Concerns  Post-Acute Placement   Other Concerns:    SOCIAL WORK ASSESSMENT / PLAN CSW received notification from MD this morning that pt son interested in exploring residential hospice.    CSW received return phone call from pt son, [name]. CSW discussed recommendation for residential hospice placement. CSW reviewed options for residential hospice placement and pt son is interested in [facility]. CSW encouraged pt son to consider second options in the case that [facility] is not available and pt son expressed understanding. CSW provided support to pt son and pt son has good understanding that pt is nearing end of life and is concentrated on ensuring pt is comfortable at this time. CSW clarified questions and will continue to communicate with pt son re: residential hospice placement.    CSW contacted [employer], Forrestine Him and made referral for [facility].    CSW to continue to follow to explore residential hospice placement.   Assessment/plan status:  Psychosocial Support/Ongoing Assessment of Needs Other assessment/ plan:   discharge planning   Information/referral to community resources:   Discussion with pt son re: residential hospice options    PATIENT'S/FAMILY'S RESPONSE TO PLAN OF CARE: Pt  unable to participate in assessment. Pt son appears to be coping well at this time and pt son wants to ensure that if residential hospice bed becomes available at pt is stable to make transistion to residential hospice facility.     [name], MSW, LCSWA  Clinical Social Work [phone]

## 2013-02-10 MED ORDER — LORAZEPAM 2 MG/ML PO CONC: 1.0000 mg | ORAL | Status: AC | PRN

## 2013-02-10 MED ORDER — MORPHINE SULFATE (CONCENTRATE) 10 MG /0.5 ML PO SOLN: 5.0000 mg | ORAL | Status: AC | PRN

## 2013-02-10 MED ORDER — ACETAMINOPHEN 650 MG RE SUPP: 650.0000 mg | RECTAL | Status: DC | PRN

## 2013-02-14 LAB — STOOL CULTURE: Special Requests: NORMAL

## 2013-03-01 ENCOUNTER — Telehealth: Payer: Self-pay | Admitting: Obstetrics and Gynecology

## 2013-03-09 NOTE — Progress Notes (Signed)
PT Cancellation Note  Patient Details Name: [name] MRN: [id number] DOB: 09/23/1924   Cancelled Treatment:    Reason Eval/Treat Not Completed: Pain limiting ability to participate;Other (comment) RN stated that pt. Is too painful, yells out with any movement and suggests  To not attempt today. Pt. Is for Hospice facility admit, comfort cre.   Rada Hay 03/05/2013, 3:34 PM

## 2013-03-09 NOTE — Progress Notes (Signed)
Patient ZO:[id number] Marlissa Emerick      DOB: 08-16-1924      EAV:[id number]  Present when [name] arrive to be with his mom postmortem.  Comforted him and witnessed the transfer of her ring to [place] .  It was placed in his right jacket pocket.  Affirmed funeral home was [facility] on [address].   Petro Talent L. Ladona Ridgel, MD MBA The Palliative Medicine Team at [facility] Phone: [phone] Pager: [phone]

## 2013-03-09 NOTE — Progress Notes (Addendum)
CSW received notification that MD had completed discharge information.   CSW spoke to [facility], Forrestine Him who stated that [facility] does not have bed availability.  CSW spoke with pt son re: secondary options. Pt son agreeable to referral being placed to [facility] and depending on the availability at [facility] pt son would be willing to explore SNF as other options, but states that he would only be agreeable to SNF if Medicare was guaranteed to cover placement. CSW will have to discuss with SNF facility re: coverage.  CSW contacted [facility] and sent referral.  CSW awaiting response from [facility] and will explore SNF options as third options.  Addendum 1:30PM:  CSW received notification from [facility] [place] that referral was received, but no beds available at this time. CSW discussed with pt son via telephone who was agreeable to initiation of SNF search in [facility]. CSW initiated SNF search and will follow up with pt son with bed offers.  Addendum 3:30PM: CSW received notification from [facility] stating that facility could offer pt a bed and could accept pt today, but would need son to complete admission paperwork.  CSW contacted pt son via telephone and left voice message. Pt son had previously told this CSW that he had meetings arranged this afternoon re: funeral arrangements and would be unavailable until after 5 pm. Per [facility], admission paperwork needs to be completed today for pt transfer to SNF.   Addendum 5:15 PM: CSW was able to speak with pt son re: disposition plan. Pt son is agreeable to pt transferring to [facility] with Pall[facility] Services following. Pt son states that he will get to [facility] as soon as possible, but was just leaving [place] and given rush hour traffic it may take him some time  to get to facility. Pt son expressed frustration that pt could not be transferred to [facility] without admission paperwork being completed prior to transfer. CSW attempted to explain to pt son that facilities have to have consent to treat the patient and it is a liability if the facility accepts pt without admission paperwork being completed. CSW provided support as pt son discussed that it had been a long and difficult day as he has been attempting to do pre-arrangements for pt funeral and although he knew pt would likely be discharged, he is unable to comprehend why this CSW cannot arrange transport for pt to [facility] prior to pt son arriving to complete paperwork. CSW attempted to explore with [facility] other options for completion of paperwork including facility getting verbal consent from son in order for CSW to go ahead and arrange transportation, but unfortunately due to company policy, the facility cannot use verbal consent for admission paperwork and consent to treat. [facility] admission coordinator planned to contact pt son to discuss this and pt son was planning on arriving to [facility] as soon as he was able.   CSW received notification from [facility] that after discussion with pt son the facility cannot accept pt today and did not feel this pt would be appropriate for facility at this time.   Palliative Medicine Team MD rounded on patient during process of attempting to get pt transferred to [facility] and states that pt has become modeled and agonal and is not appropriate for transfer at this time.  PMT MD contacted attending MD and Dr. Jacky Kindle to notify of pt current condition.   CSW contacted pt son and left two voice messages notifying pt son to come to hospital instead of going to [facility] as pt condition has changed.   CSW was able to reach pt son to discuss and PMT MD, Dr. Ladona Ridgel was able to discuss with pt son.   CSW to continue to follow and  assist as appropriate.    [name], MSW, LCSWA  Clinical Social Work [phone]

## 2013-03-09 NOTE — Progress Notes (Signed)
Patient was pronounced deceased at 19:05 by Dr. Ladona Ridgel and confirmed by this writer. Apical pulse was not auscultated upon exam, no respirations were noted. [place] [facility] were contacted by this Clinical research associate. Son now at bedside, comfort and support were given. [employer] on [address] is the funeral home selected by the patient's son. Patient's silver ring with clear stone was removed from the 4th finger on left hand and given to the son, [name].

## 2013-03-09 NOTE — Consult Note (Signed)
[facility] Liaison: Continue to follow for interest in [facility]. Unfortunately no [facility] availability today or over weekend. Will continue to follow until disposition determined. Thank you. Forrestine Him LCSW [phone]

## 2013-03-09 NOTE — Discharge Summary (Signed)
Physician Discharge Summary  [name] ZOX:[id number] DOB: [birth year] DOA: 01/23/2013  PCP: Ginette Otto, MD  Admit date: 02/02/2013 Discharge date: 02/08/2013  Time spent: 25 minutes  Recommendations for Outpatient Follow-up:  1. Patient to be d/c to the care of Hospice   Discharge Diagnoses:  Principal Problem:   GI bleed with acute blood loss anemia Active Problems:   Weight loss   Hypothyroidism   Diabetes   Cold intolerance   Generalized weakness with fall   Alzheimer's dementia   Hypertension   Hyperlipidemia   History of CVA (cerebrovascular accident)   Diarrhea   Leukocytosis   Enteritis   Acute mesenteric ischemia   Discharge Condition: Guarded  Diet recommendation:  Soft diet.  Comfort feeds  Filed Weights   02/09/13 0055  Weight: 53.1 kg (117 lb 1 oz)    History of present illness:  79 year old Caucasian female admitted 3 01/07/2013 with abdominal pain weakness dark stools.-This been going on for the past 2 weeks with noted right lower quadrant pain poor by mouth appetite and recent falls. No NSAID use. She was admitted transfused 2 units packed red blood cells and gastroenterology was consulted as per below for further work-up  Hospital Course:  GI bleed with acute blood loss anemia  -Transfused 2 units of packed red blood cells 3/31  - no further melena noted  -GI consulted who recommended MRA abdomen and requested potentitial work-up as below 2. Mesenteric ischemia -exam with significant abdominal tenderness, was on empiric Zosyn till 4.2.14  -MRA abdomen shows significant SMA stenosis-Appreciate Vascular input on 4.1 from Dr. Wardell Honour intervention planned given poor candidate for operation c advanced dementia and no great change in quality of life -repeat WBC 17,000  -lactic acid 1.1 reassuring  3. Weight loss  -Patient appears to have weight loss from changes to her appetite secondary to subacute illness.  - continue synthroid  4.  Hypothyroidism  -Continue current dose of Synthroid  -defer increasing dose synthroid-unlikely to make a global change to her status  5. Diabetes  -Hemoglobin A1c done 01/20/2013: 5.9%  Lantus and CBG checks have been d/c  -if unresponsive, might check blood sugars  6. Generalized weakness with fall  -Likely multifactorial. Formal PT/OT evaluations when more stable.  7. Alzheimer's dementia  -discontinue Aricept.  8. Hypertension  -discontinue lisinopril.  9. History of CVA (cerebrovascular accident)  -No deficits per her son. Hold aspirin.  -continue zetia  Consultants:  Palliative  Vascular surgery  [facility] Antibiotics:  Zosyn 3/31  Discharge Exam: Filed Vitals:   02/08/13 2125 02/09/13 0055 02/09/13 0624 02/08/2013 0504  BP: 189/90 188/72 178/82 144/73  Pulse: 89 88 91 83  Temp: 98.8 F (37.1 C) 98.1 F (36.7 C) 98 F (36.7 C) 98.9 F (37.2 C)  TempSrc: Oral Oral Oral Axillary  Resp: 20 18 18 16   Height:  5\' 3"  (1.6 m)    Weight:  53.1 kg (117 lb 1 oz)    SpO2: 98% 100% 98% 100%   More alert.  Pleasant no discomoft at rest  General: alert pleasant oriented.  NAd Cardiovascular: s1 s 2no m/r/g  Respiratory: clear no added sound  Discharge Instructions  Discharge Orders   Future Appointments Provider Department Dept Phone   07/11/2013 2:45 PM Alison Murray, MD [facility] [phone]   Future Orders Complete By Expires     Call MD for:  hives  As directed     Call MD for:  persistant dizziness  or light-headedness  As directed     Call MD for:  persistant nausea and vomiting  As directed     Call MD for:  redness, tenderness, or signs of infection (pain, swelling, redness, odor or green/yellow discharge around incision site)  As directed     Diet - low sodium heart healthy  As directed     Increase activity slowly  As directed         Medication List    STOP taking these medications       aspirin 325 MG tablet     calcium-vitamin D  500-200 MG-UNIT per tablet  Commonly known as:  OSCAL WITH D     ezetimibe 10 MG tablet  Commonly known as:  ZETIA     ferrous sulfate 325 (65 FE) MG tablet     Glucosamine 500 MG Caps     insulin glargine 100 units/mL Soln  Commonly known as:  LANTUS     lisinopril 20 MG tablet  Commonly known as:  PRINIVIL,ZESTRIL     Melatonin 3 MG Tabs     MULTIVITAMIN PO     NOVOLOG FLEXPEN 100 UNIT/ML injection  Generic drug:  insulin aspart     TYLENOL ARTHRITIS PAIN PO     Vitamin D (Ergocalciferol) 50000 UNITS Caps  Commonly known as:  DRISDOL      TAKE these medications       donepezil 10 MG tablet  Commonly known as:  ARICEPT  Take 10 mg by mouth at bedtime.     levothyroxine 112 MCG tablet  Commonly known as:  SYNTHROID, LEVOTHROID  Take 112 mcg by mouth daily.     LORazepam 2 MG/ML concentrated solution  Commonly known as:  ATIVAN  Take 0.5 mLs (1 mg total) by mouth every 4 (four) hours as needed.     morphine CONCENTRATE 10 mg / 0.5 ml concentrated solution  Take 0.25 mLs (5 mg total) by mouth every 2 (two) hours as needed.          The results of significant diagnostics from this hospitalization (including imaging, microbiology, ancillary and laboratory) are listed below for reference.    Significant Diagnostic Studies: Ct Abdomen Pelvis W Contrast  01/17/2013  *RADIOLOGY REPORT*  Clinical Data: Right hip and lower abdominal pain.  Anemia.  The patient fell 1 month ago.  White cell count 37.9.  CT ABDOMEN AND PELVIS WITH CONTRAST  Technique:  Multidetector CT imaging of the abdomen and pelvis was performed following the standard protocol during bolus administration of intravenous contrast.  Contrast: OMNIPAQUE IOHEXOL 300 MG/ML  SOLN  Comparison: None.  Findings: Atelectasis in the lung bases.  Minimal left pleural effusion.  Coronary artery calcifications.  The liver, spleen, gallbladder, pancreas, adrenal glands, kidneys, inferior vena cava, and  retroperitoneal lymph nodes are unremarkable except for vascular calcifications.  Extensive calcification of the abdominal aorta.  Tortuous aorta.  Focal aortic calcifications at the abdominal inlet may contribute to focal stenosis.  Small bowel are mildly distended and predominately fluid-filled with suggestion of diffuse wall thickening involving the small bowel, terminal ileum, and ascending colon.  Changes are most consistent with gastroenteritis or other inflammatory process. No pneumatosis or portal venous gas.   There are calcifications at the origin of the superior mesenteric artery and focal stenosis is not excluded. The superior mesenteric artery and vein appear patent. Correlate for clinical or laboratory evidence of early bowel ischemia.  Pelvis:  The bladder is decompressed with  Foley catheter.  Uterus is normal size.  No abnormal adnexal masses.  No free or loculated pelvic fluid collections.  No significant pelvic lymphadenopathy. The appendix is not visualized.  No evidence of diverticulitis. Degenerative changes in the lumbar spine.  No displaced fractures identified.  IMPRESSION: Fluid filled nondistended small bowel with diffuse wall thickening of the small bowel.  This is likely to represent gastroenteritis or inflammatory disease.  However, there is calcification in the origin of the superior mesenteric artery and mesenteric ischemia should be excluded.  No specific signs of pneumatosis or mesenteric edema.   Original Report Authenticated By: Burman Nieves, M.D.    Mr [facility] Abdomen W Wo Contrast  02/07/2013  *RADIOLOGY REPORT*  Clinical Data:  Abdominal pain.  MRA ABDOMEN WITH AND WITHOUT CONTRAST  Technique:  Angiographic images of the abdomen were obtained using MRA technique with intravenous contrast.  Contrast:  13mL MULTIHANCE GADOBENATE DIMEGLUMINE 529 MG/ML IV SOLN  Comparison:   None.  Findings:  There is atherosclerotic disease of the abdominal aorta without evidence of aneurysm.  The  celiac axis appears chronically occluded.  There is significant stenosis involving the proximal superior mesenteric artery with stenosis likely approaching 70 - 90% in estimated narrowing by MRA.  The main trunk is opacified and the superior mesenteric artery is not completely occluded.  The inferior mesenteric artery is open.  A single right renal artery and two separate left renal arteries are demonstrated and show normal patency.  There is no significant nonvascular findings by MRA.  IMPRESSION:  1.  Chronic occlusion of the celiac axis. 2.  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  The inferior mesenteric artery is open.   Original Report Authenticated By: Irish Lack, M.D.    Dg Abd Acute W/chest  2013-03-07  *RADIOLOGY REPORT*  Clinical Data: Abdominal pain and distension.  ACUTE ABDOMEN SERIES (ABDOMEN 2 VIEW & CHEST 1 VIEW)  Comparison: Chest 05/29/2009  Findings: The patient is rotated.  Shallow inspiration with lucency in the lungs suggesting emphysema.  Normal heart size and pulmonary vascularity.  Calcified granulomas in the lungs.  No focal consolidation or airspace disease.  No blunting of costophrenic angles.  No pneumothorax.  Degenerative changes in the spine and shoulders.  Resection or resorption of the distal right clavicle.  There are multiple gas-filled loops of small bowel in the mid abdomen without distension.  There is suggestion of wall thickening and fold thickening.  Changes are most consistent with inflammatory bowel disease versus gastroenteritis.  Gas and stool throughout the colon without distension.  No free intra-abdominal air.  No abnormal air fluid levels.  Degenerative changes in the spine.  No radiopaque stones.  Vascular calcifications.  IMPRESSION: No evidence of active pulmonary disease.  Gas filled nondistended small bowel with wall thickening suggesting infiltrative process such as gastroenteritis, edema, or inflammatory  bowel disease.   Original Report Authenticated By: Burman Nieves, M.D.     Microbiology: Recent Results (from the past 240 hour(s))  CLOSTRIDIUM DIFFICILE BY PCR     Status: None   Collection Time    02/07/13  1:20 PM      Result Value Range Status   C difficile by pcr NEGATIVE  NEGATIVE Final  STOOL CULTURE     Status: None   Collection Time    02/07/13  1:20 PM      Result Value Range Status   Specimen Description STOOL   Final   Special Requests Normal  Final   Culture NO SUSPICIOUS COLONIES, CONTINUING TO HOLD   Final   Report Status PENDING   Incomplete     Labs: Basic Metabolic Panel:  Recent Labs Lab 01/26/2013 0305 02/07/13 0255 02/08/13 0506  NA 137 140 141  K 3.6 3.0* 3.4*  CL 102 108 114*  CO2 27 26 24   GLUCOSE 99 75 83  BUN 41* 31* 24*  CREATININE 0.79 0.82 0.75  CALCIUM 9.1 7.9* 7.7*   Liver Function Tests:  Recent Labs Lab 01/28/2013 0305  AST 29  ALT 20  ALKPHOS 107  BILITOT 0.3  PROT 5.5*  ALBUMIN 2.6*   No results found for this basename: LIPASE, AMYLASE,  in the last 168 hours No results found for this basename: AMMONIA,  in the last 168 hours CBC:  Recent Labs Lab 01/18/2013 0305  02/07/13 0255 02/07/13 1110 02/07/13 1344 02/07/13 2038 02/08/13 0506 02/08/13 1314  WBC 37.9*  --  30.9*  --  31.2* 30.2* 22.7* 17.0*  NEUTROABS 32.6*  --   --   --   --   --   --   --   HGB 6.5*  < > 8.2* 8.1* 8.4* 8.6* 8.0* 8.1*  HCT 19.5*  --  23.8*  --  24.9* 25.7* 24.4* 24.7*  MCV 92.4  --  84.5  --  86.5 87.1 87.8 88.2  PLT 459*  --  343  --  347 329 304 330  < > = values in this interval not displayed. Cardiac Enzymes: No results found for this basename: CKTOTAL, CKMB, CKMBINDEX, TROPONINI,  in the last 168 hours BNP: BNP (last 3 results) No results found for this basename: PROBNP,  in the last 8760 hours CBG:  Recent Labs Lab 02/08/13 0409 02/08/13 0711 02/08/13 1129 02/08/13 1629 02/08/13 2009  GLUCAP 77 113* 108* 194* 205*        Signed:  Rhetta Mura  [facility] Feb 26, 2013, 8:58 AM

## 2013-03-09 NOTE — Progress Notes (Signed)
Patient [name]      DOB: September 08, 1924      EAV:[id number]   Patient reexamined, found to be agonal.  We surrounded her with love as she left this world at 1905 pm.   She had no further respirations, no palpable pulse.  Nurse Dora Sims confirmed death with me at the bedside.  Called son back whom I spoke with earlier to alert that we were getting close and let him know that she had passed.  He will come as soon as possible.     Updated [facility] on Call.  Dr. Mahala Menghini will complete death summary in the AM.  Death certificate completed by Dr. Ladona Ridgel- Cause of death Mesenteric Ischemia, Gastrointestinal bleeding Time of Death 1905  Meygan Kyser L. Ladona Ridgel, MD MBA The Palliative Medicine Team at [facility] Phone: [phone] Pager: [phone]   Total Time 20 min

## 2013-03-09 NOTE — Progress Notes (Signed)
Patient AV:WUJW [name]      DOB: 05-15-1924      JXB:[id number]   Palliative Medicine Team at [facility] Progress Note    Subjective:  Arrived to find patient being prepared for discharge to SNF.  Patient appears mottled and cold, with ketotic breath.  This is a change from this am..  Filed Vitals:   02/25/2013 0504  BP: 144/73  Pulse: 83  Temp: 98.9 F (37.2 C)  Resp: 16   Physical exam:  General:  Pale, agonal, cold Pupils not examined Chest decreased, blowing respirations CVS: irregular, S1, S2 Abd: no grimace to palpation ZHY:QMVH and mottled, blowing respirations  Neuro: not rousable to tactile or verbal stimuli    Assessment and plan: 79 yr old white female admitted with black loose stools found to have mesenteric ischemia.  Son elected to pursue comfort care.  This morning she was awake and interacting some but sleeping most of the day. This afternoon she became agitated and required ativan.  This evening she was found to be cool and mottling with change in her respirations.  We alerted her physician and left messages for her son regarding her change in status.  1.  DNR/ DNI  2.  At end of life: be present with her .  3.  Dyspnea:  Prn opiates  4. Agitation : re dose benzos if needed.  Discussed with Dr. Mahala Menghini and Dr. Jacky Kindle.   Total Time:  25 min  Tracy Kinner L. Ladona Ridgel, MD MBA The Palliative Medicine Team at [facility] Phone: [phone] Pager: [phone]

## 2013-03-09 NOTE — Progress Notes (Signed)
Patient [name]      DOB: 16-Sep-1924      EAV:[id number]   Palliative Medicine Team at [facility] Progress Note    Subjective: Patient will open her eyes to gentle tactile and verbal stimuli she is in no acute distress she is pleasantly confused she has not requested any food has been sleeping most of the day and is much more quiet compared with yesterday     Filed Vitals:   02/09/13 0504  BP:  170/82   Pulse:  91   Temp: 98  Resp: 1 8    Physical exam: Gen. in no acute distress, pale Pupils are equal reactive to light, extraocular muscles are intact, mucous membranes are dry Chest is decreased but clear to auscultation no rhonchi rales or wheezes Cardiovascular regular rate rhythm positive S1 and S2 no S3-4 murmurs or gallops Extremities are warm without mottling  Neuro is pleasantly confused, sleeping a lot more today      Assessment and plan 79 year old white female with mesenteric ischemia currently on comfort measures. Patient is declining slowly but is stable for potential disposition to residential hospice #1 DO NOT RESUSCITATE DO NOT INTUBATE #2 pain continue when necessary morphine patient received a total of 10 mg of morphine on 02/08/2013:  Prognosis likely days as the patient is not eating or drinking Disposition residential hospice has been offered we await bed availability    Total time 15 minutes. I did review this with the patient's son by phone   [name] L. Ladona Ridgel, MD MBA The Palliative Medicine Team at [facility] Phone: [phone] Pager: [phone]

## 2013-03-09 DEATH — deceased

## 2013-07-11 ENCOUNTER — Ambulatory Visit: Payer: Self-pay | Admitting: Obstetrics and Gynecology

## 2013-12-30 IMAGING — CT CT ABD-PELV W/ CM
1 of 3 series · 13 of 32 positions shown, 18 images · IV contrast (100 ML OMNI 300)
Comparison: None.

CLINICAL DATA: Right hip and lower abdominal pain.  Anemia.  The
patient fell 1 month ago.  White cell count 37.9.

CT ABDOMEN AND PELVIS WITH CONTRAST
TECHNIQUE: Multidetector CT imaging of the abdomen and pelvis was
performed following the standard protocol during bolus
administration of intravenous contrast.
Contrast: 100mL OMNIPAQUE IOHEXOL 300 MG/ML  SOLN

[Series 2: abd/pel with · axial · 0.68mm/px · z∈[+1222,+1587]mm · 13 of 83 slices shown, 18 images]
[im 5/83  soft-tissue]
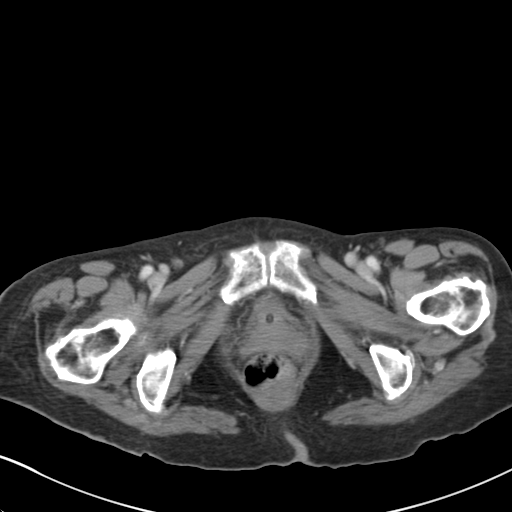
[im 5/83  bone]
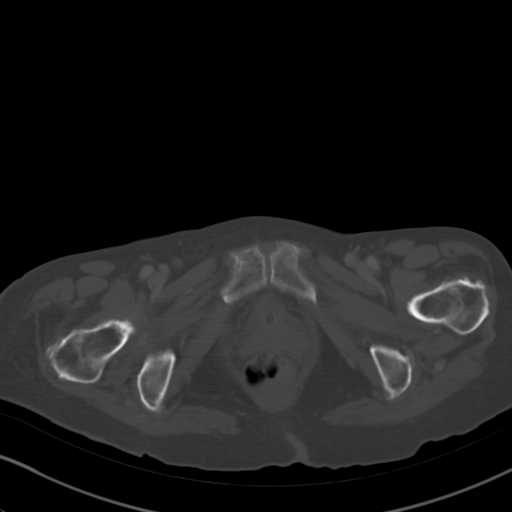
[im 14/83  soft-tissue]
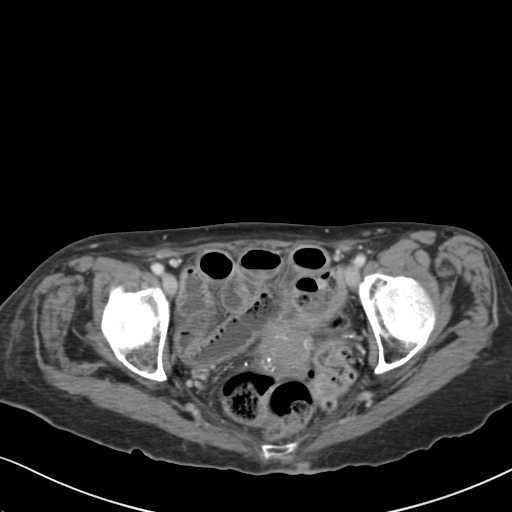
[im 19/83  soft-tissue]
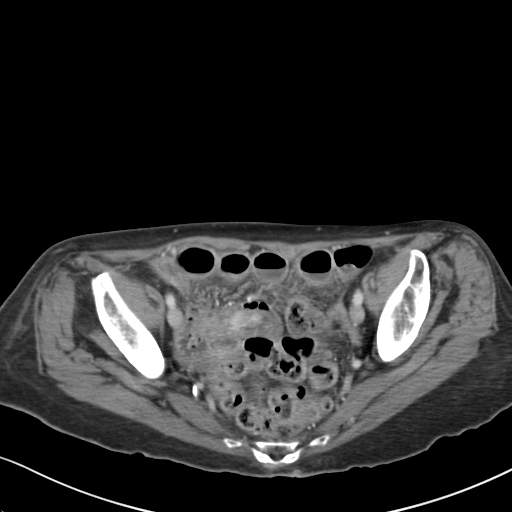
[im 23/83  soft-tissue]
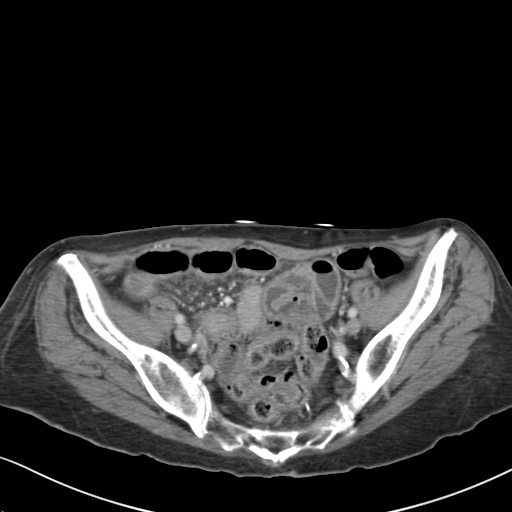
[im 32/83  soft-tissue]
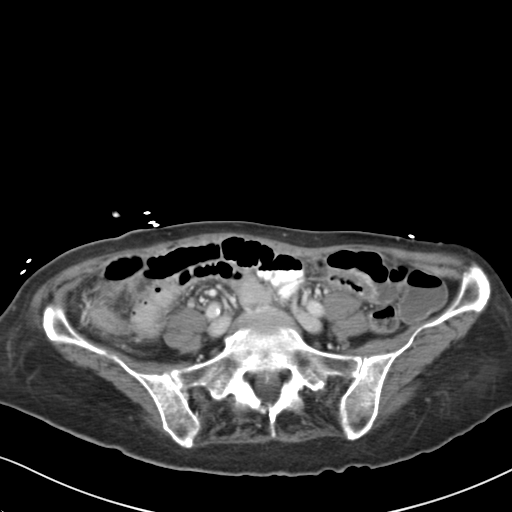
[im 37/83  soft-tissue]
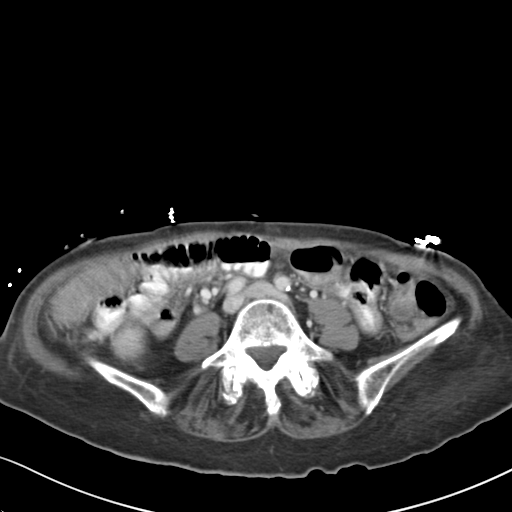
[im 46/83  soft-tissue]
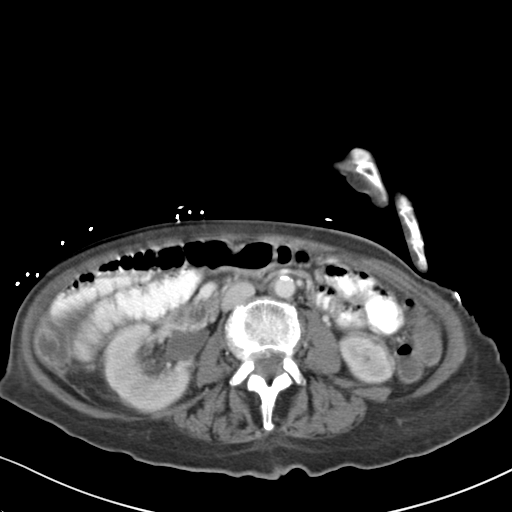
[im 51/83  soft-tissue]
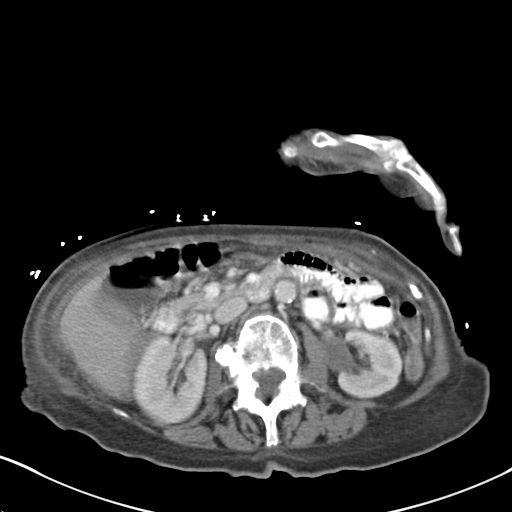
[im 60/83  soft-tissue]
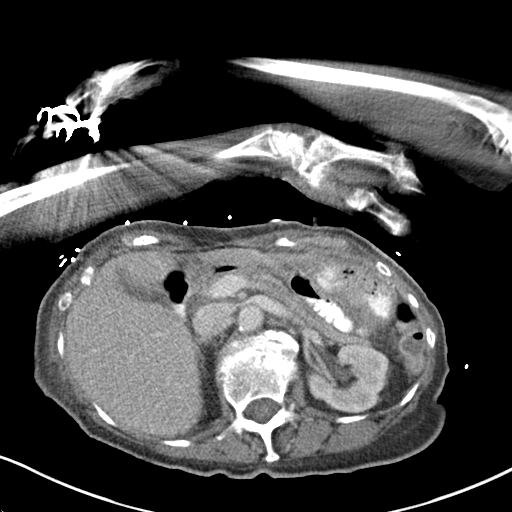
[im 60/83  bone]
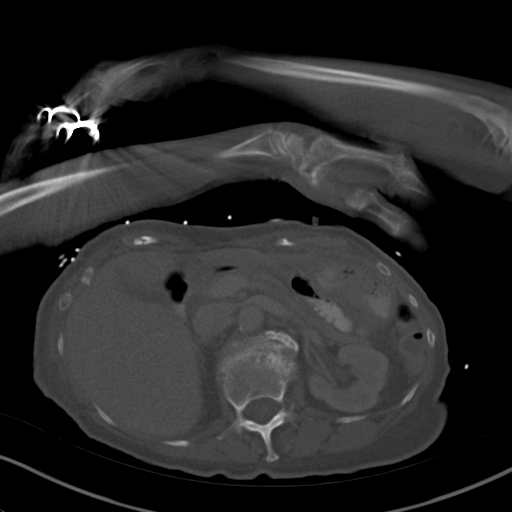
[im 64/83  soft-tissue]
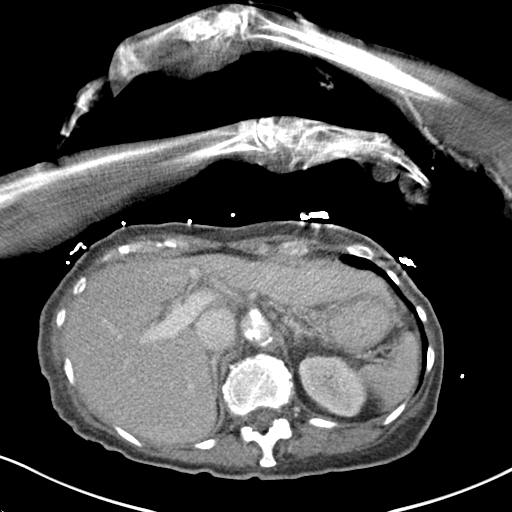
[im 64/83  lung]
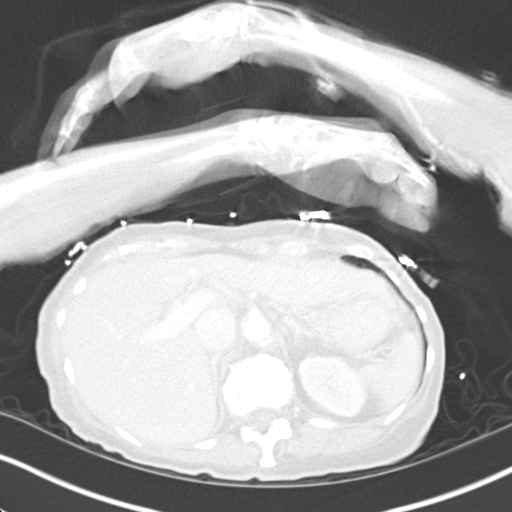
[im 69/83  soft-tissue]
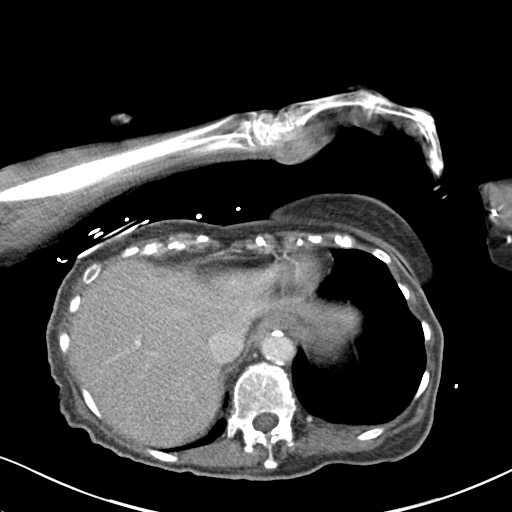
[im 69/83  lung]
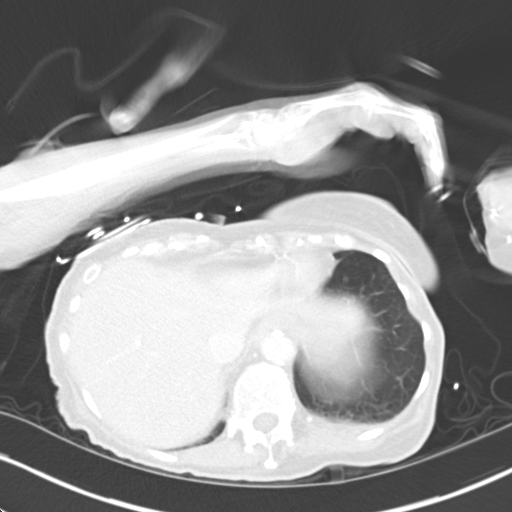
[im 73/83  lung]
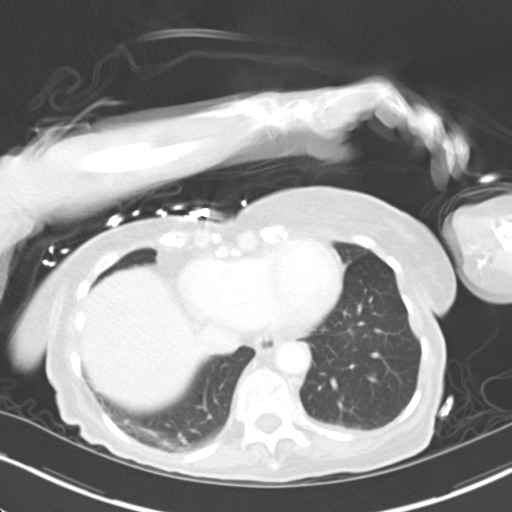
[im 78/83  soft-tissue]
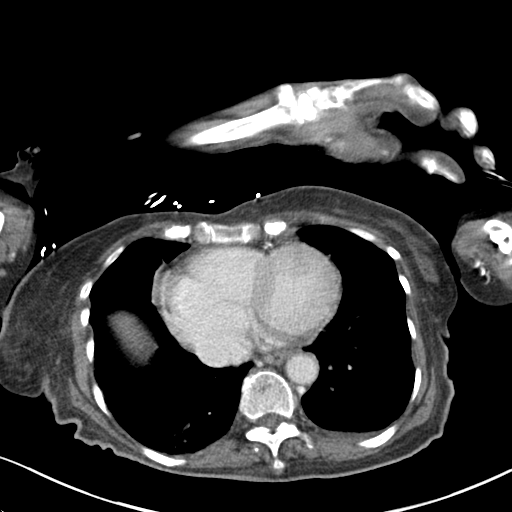
[im 78/83  lung]
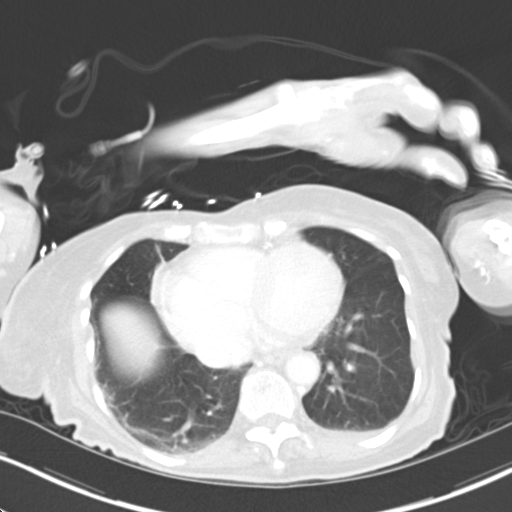

[13 of 32 positions shown; findings below may reference images not displayed]

FINDINGS: Atelectasis in the lung bases.  Minimal left pleural
effusion.  Coronary artery calcifications.

The liver, spleen, gallbladder, pancreas, adrenal glands, kidneys,
inferior vena cava, and retroperitoneal lymph nodes are
unremarkable except for vascular calcifications.  Extensive
calcification of the abdominal aorta.  Tortuous aorta.  Focal
aortic calcifications at the abdominal inlet may contribute to
focal stenosis.  Small bowel are mildly distended and predominately
fluid-filled with suggestion of diffuse wall thickening involving
the small bowel, terminal ileum, and ascending colon.  Changes are
most consistent with gastroenteritis or other inflammatory process.
No pneumatosis or portal venous gas.   There are calcifications at
the origin of the superior mesenteric artery and focal stenosis is
not excluded. The superior mesenteric artery and vein appear
patent. Correlate for clinical or laboratory evidence of early
bowel ischemia.

Pelvis:  The bladder is decompressed with Foley catheter.  Uterus
is normal size.  No abnormal adnexal masses.  No free or loculated
pelvic fluid collections.  No significant pelvic lymphadenopathy.
The appendix is not visualized.  No evidence of diverticulitis.
Degenerative changes in the lumbar spine.  No displaced fractures
identified.
IMPRESSION: Fluid filled nondistended small bowel with diffuse wall thickening
of the small bowel.  This is likely to represent gastroenteritis or
inflammatory disease.  However, there is calcification in the
origin of the superior mesenteric artery and mesenteric ischemia
should be excluded.  No specific signs of pneumatosis or mesenteric
edema.

## 2014-01-25 NOTE — Telephone Encounter (Signed)
Disregard this message made in error. °
